# Patient Record
Sex: Female | Born: 1975 | Hispanic: No | Marital: Married | Smoking: Never smoker
Health system: Southern US, Community
[De-identification: ages and names within clinical notes are randomized; demographics above are authoritative.]

---

## 2015-04-02 ENCOUNTER — Encounter (HOSPITAL_COMMUNITY): Payer: Self-pay | Admitting: Emergency Medicine

## 2015-04-02 ENCOUNTER — Emergency Department (INDEPENDENT_AMBULATORY_CARE_PROVIDER_SITE_OTHER)
Admission: EM | Admit: 2015-04-02 | Discharge: 2015-04-02 | Disposition: A | Discharge status: Home / Self Care | Payer: Medicaid Other | Source: Home / Self Care | Attending: Emergency Medicine | Admitting: Emergency Medicine

## 2015-04-02 DIAGNOSIS — J039 Acute tonsillitis, unspecified: Secondary | ICD-10-CM

## 2015-04-02 DIAGNOSIS — R0981 Nasal congestion: Secondary | ICD-10-CM | POA: Diagnosis not present

## 2015-04-02 MED ORDER — AMOXICILLIN 500 MG PO CAPS: 500.0000 mg | ORAL_CAPSULE | Freq: Two times a day (BID) | ORAL | Status: DC

## 2015-04-02 MED ORDER — FLUTICASONE PROPIONATE 50 MCG/ACT NA SUSP: 2.0000 | Freq: Every day | NASAL | Status: DC

## 2015-04-02 MED ORDER — CETIRIZINE HCL 10 MG PO TABS: 10.0000 mg | ORAL_TABLET | Freq: Every day | ORAL | Status: DC

## 2015-04-02 NOTE — ED Notes (Signed)
Via phone interpreter (Swahili) Pt c/o cold sx onset 2 days Sx include HA, bilateral ear pain, ST, congestion, bilateral eye pain Denies fevers Alert, no signs of acute distress.

## 2015-04-02 NOTE — Discharge Instructions (Signed)
You have an infection of your sinuses and throat. Take amoxicillin twice a day for 10 days. Use the Zyrtec and Flonase daily for the next 10 days. Back in one week if not better.

## 2015-04-02 NOTE — ED Provider Notes (Signed)
CSN: [id number]     Arrival date & time 04/02/15  1342 History   First MD Initiated Contact with Patient 04/02/15 1555     Chief Complaint  Patient presents with  . URI   (Consider location/radiation/quality/duration/timing/severity/associated sxs/prior Treatment) HPI  She is a 39 year old woman here for evaluation of sinus pressure. History is limited by language barrier, phone interpreter was used. She states she has nasal congestion, sinus pressure, ear pain, eye pain, sore throat, and headaches. This has been going on for at least several days, maybe closer to a year. She denies any cough or shortness of breath. No fevers or chills. She has not tried any medications. She also reports a long history of double vision.  She has a history significant for trauma and [place] in [place].  History reviewed. No pertinent past medical history. History reviewed. No pertinent past surgical history. No family history on file. History  Substance Use Topics  . Smoking status: Never Smoker   . Smokeless tobacco: Not on file  . Alcohol Use: No   OB History    No data available     Review of Systems As in history of present illness Allergies  Review of patient's allergies indicates no known allergies.  Home Medications   Prior to Admission medications   Medication Sig Start Date End Date Taking? Authorizing Provider  amoxicillin (AMOXIL) 500 MG capsule Take 1 capsule (500 mg total) by mouth 2 (two) times daily. 04/02/15   Charm RingsErin J Honig, MD  cetirizine (ZYRTEC) 10 MG tablet Take 1 tablet (10 mg total) by mouth daily. 04/02/15   Charm RingsErin J Honig, MD  fluticasone (FLONASE) 50 MCG/ACT nasal spray Place 2 sprays into both nostrils daily. 04/02/15   Charm RingsErin J Honig, MD   BP 138/93 mmHg  Pulse 81  Temp(Src) 98.3 F (36.8 C) (Oral)  Resp 16  SpO2 99%  LMP 03/15/2015 Physical Exam  Constitutional: She is oriented to person, place, and time. She appears well-developed and well-nourished. No distress.  HENT:   Mouth/Throat: Oropharyngeal exudate present.  Nasal mucosa is erythematous and boggy. Bilateral TMs are normal. Posterior pharynx is erythematous with exudate.  Eyes: Conjunctivae and EOM are normal. Pupils are equal, round, and reactive to light.  Neck: Neck supple.  Cardiovascular: Normal rate, regular rhythm and normal heart sounds.   No murmur heard. Pulmonary/Chest: Effort normal and breath sounds normal. No respiratory distress. She has no wheezes. She has no rales.  Lymphadenopathy:    She has no cervical adenopathy.  Neurological: She is alert and oriented to person, place, and time.  Psychiatric:  Flat affect    ED Course  Procedures (including critical care time) Labs Review Labs Reviewed - No data to display  Imaging Review No results found.   MDM   1. Exudative tonsillitis   2. Sinus congestion    Treat with amoxicillin, Zyrtec, Flonase. Follow-up in one week if not improving.    Charm RingsErin J Honig, MD 04/02/15 [phone]

## 2015-05-15 ENCOUNTER — Emergency Department (HOSPITAL_COMMUNITY)
Admission: EM | Admit: 2015-05-15 | Discharge: 2015-05-15 | Disposition: A | Discharge status: Home / Self Care | Payer: Medicaid Other | Source: Home / Self Care | Attending: Family Medicine | Admitting: Family Medicine

## 2015-05-15 ENCOUNTER — Encounter (HOSPITAL_COMMUNITY): Payer: Self-pay | Admitting: *Deleted

## 2015-05-15 ENCOUNTER — Emergency Department (INDEPENDENT_AMBULATORY_CARE_PROVIDER_SITE_OTHER): Payer: Medicaid Other

## 2015-05-15 DIAGNOSIS — G8929 Other chronic pain: Secondary | ICD-10-CM

## 2015-05-15 DIAGNOSIS — R0789 Other chest pain: Secondary | ICD-10-CM | POA: Diagnosis not present

## 2015-05-15 LAB — POCT PREGNANCY, URINE: Preg Test, Ur: NEGATIVE

## 2015-05-15 IMAGING — DX DG CHEST 2V
2 series · 2 of 2 positions shown · non-contrast
Comparison: None.

CLINICAL DATA: Chest pain

EXAM:
CHEST  2 VIEW

[chest pa]
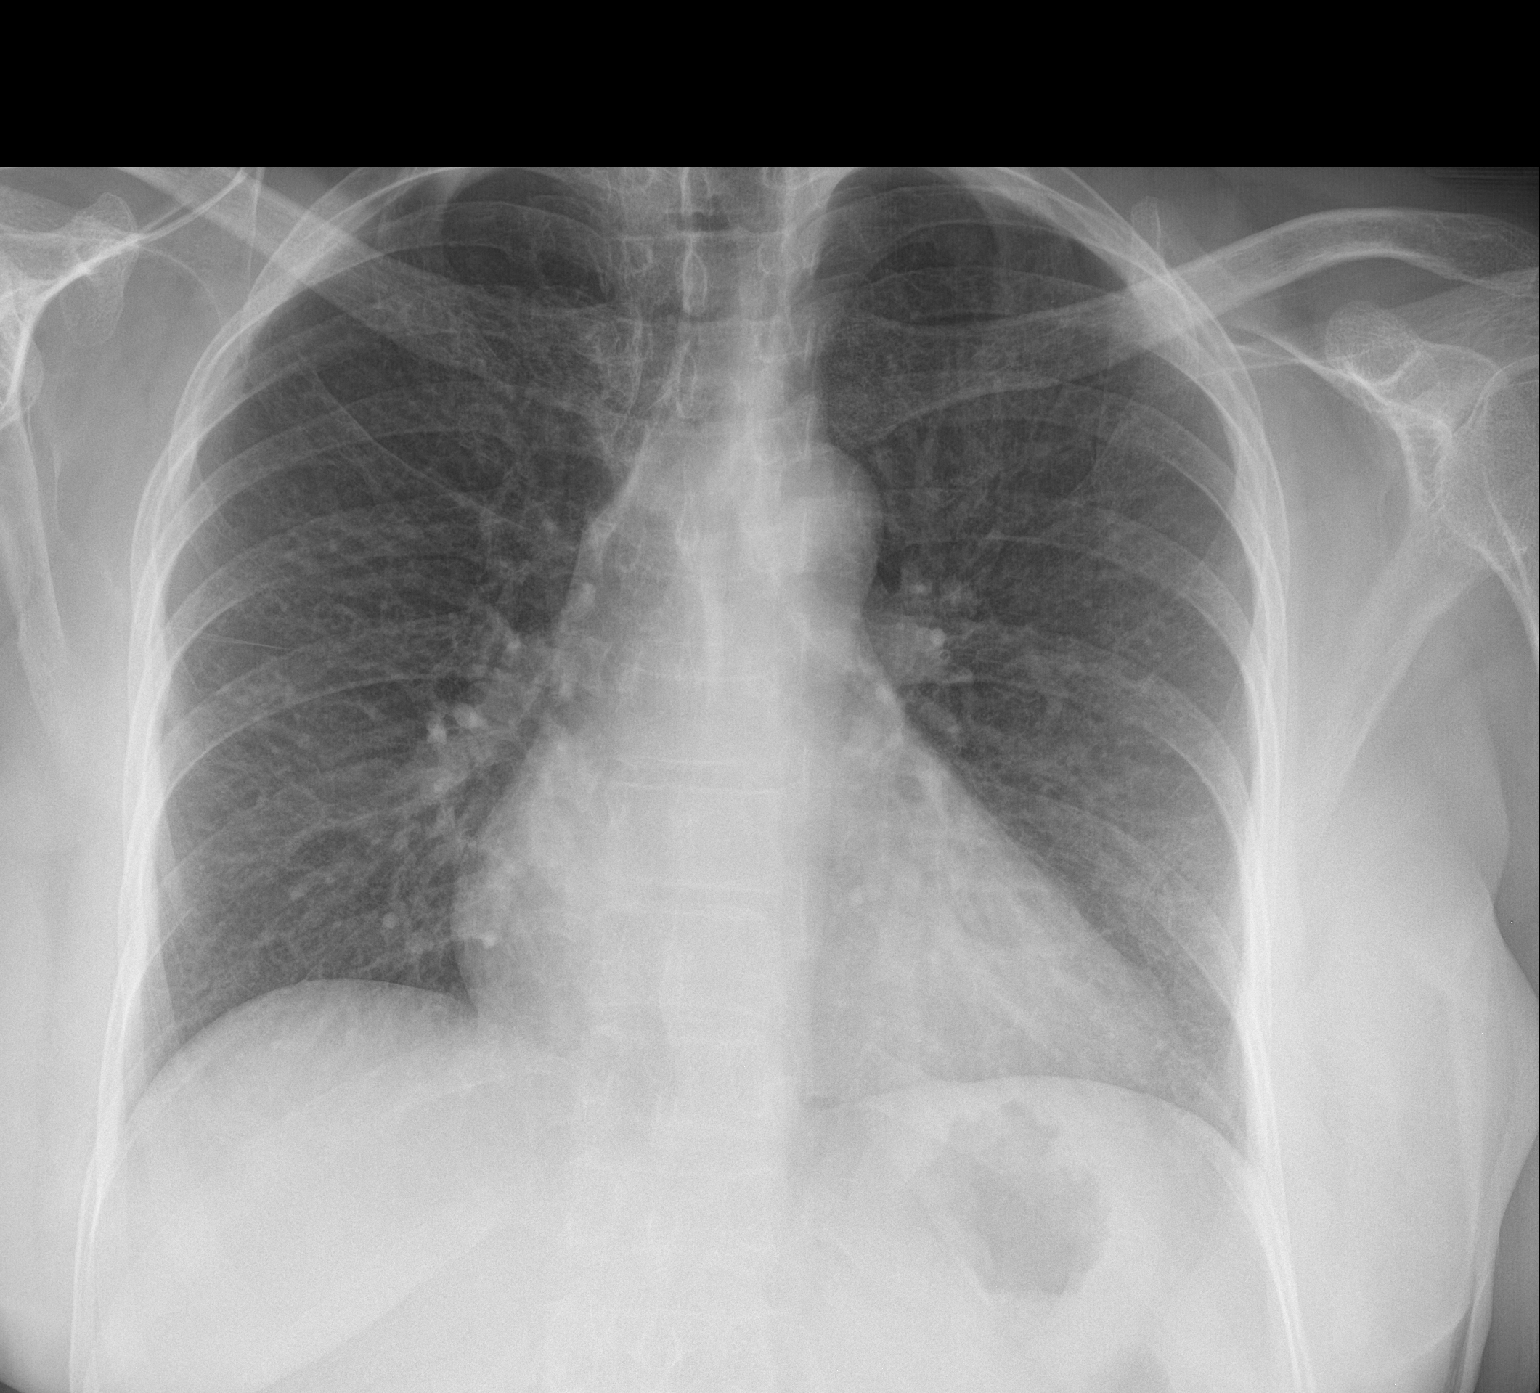

[chest lat]
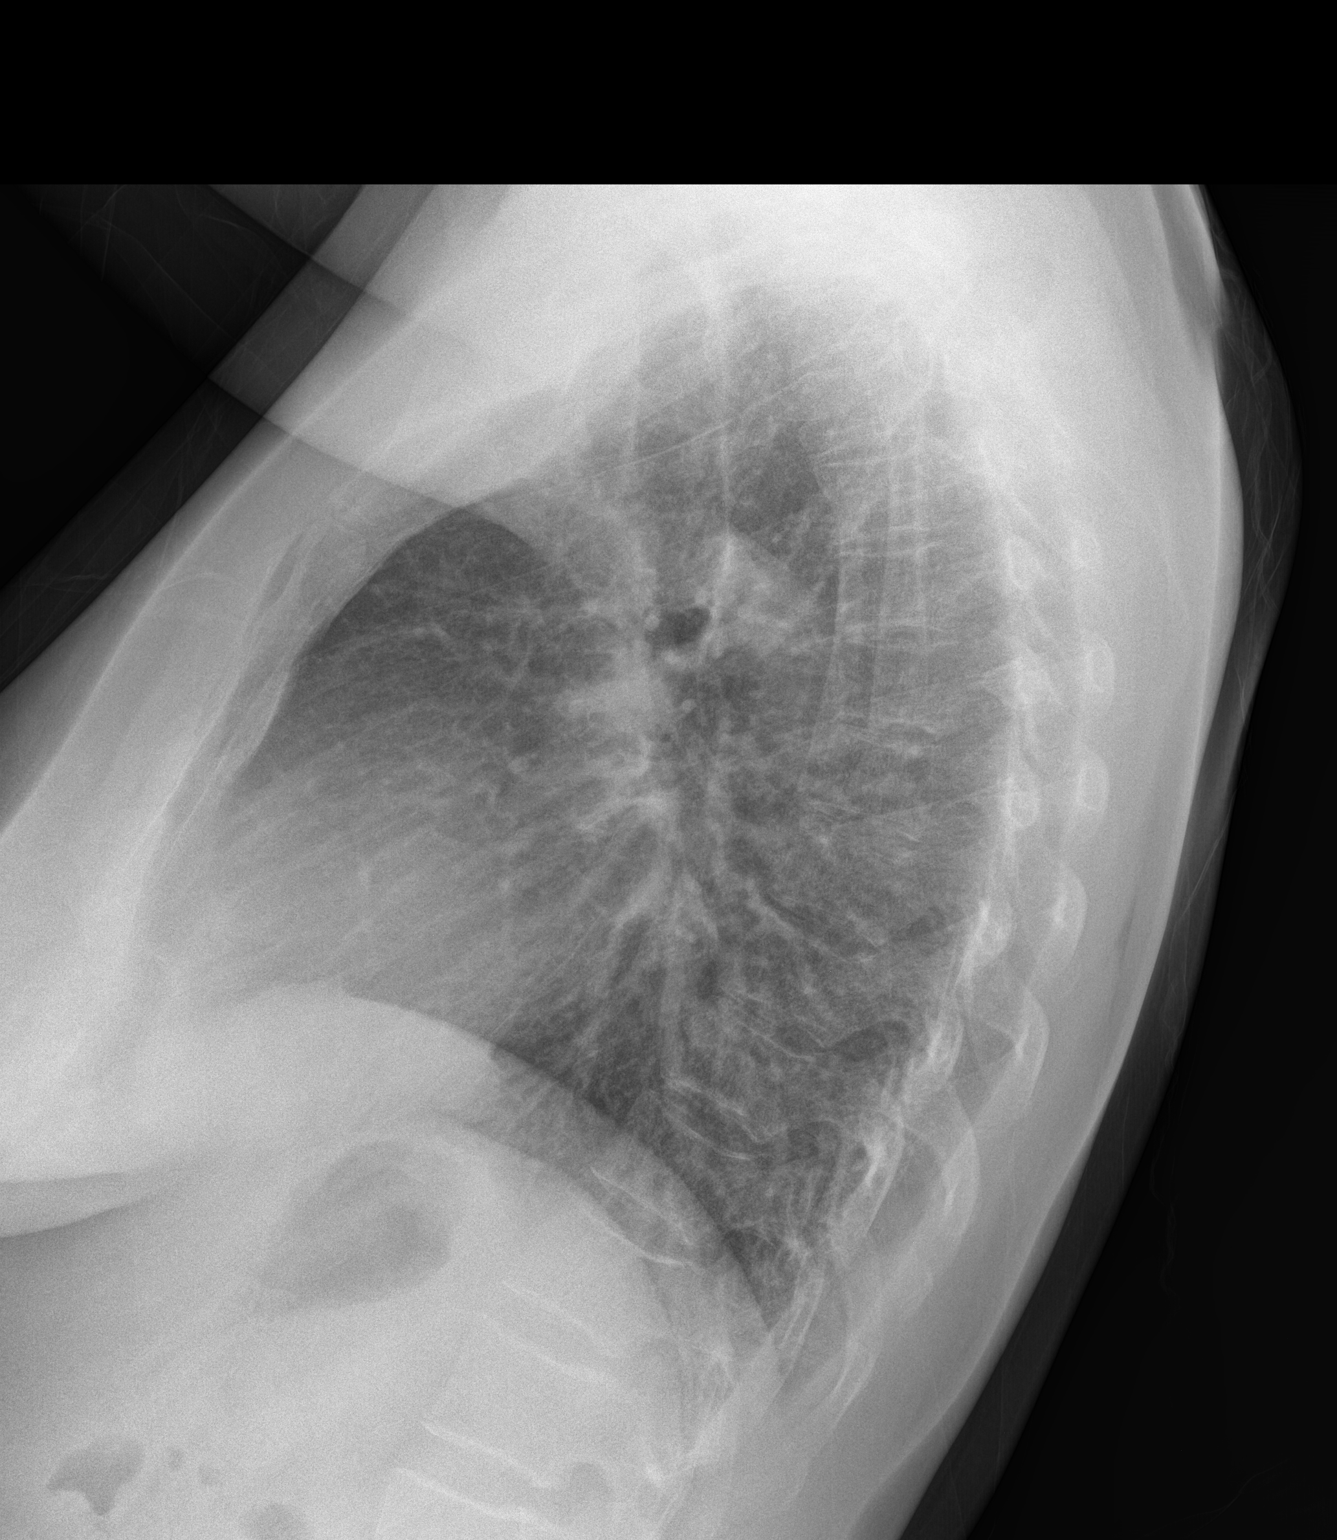

[2 of 2 positions shown; findings below may reference images not displayed]

FINDINGS: The heart size and mediastinal contours are within normal limits.
Both lungs are clear. The visualized skeletal structures are
unremarkable.
IMPRESSION: No active cardiopulmonary disease.

## 2015-05-15 MED ORDER — MELOXICAM 7.5 MG PO TABS: 7.5000 mg | ORAL_TABLET | Freq: Two times a day (BID) | ORAL | Status: DC

## 2015-05-15 NOTE — ED Notes (Addendum)
Pt  Reports   Headache   And   Pain in   Chest   When  She  Breathes        Symptoms        X        1  Year            Unsure        As  To her  Last     menstrural  Period             [address]    Interpretors  Utilized

## 2015-05-15 NOTE — ED Provider Notes (Signed)
CSN: [id number]     Arrival date & time 05/15/15  1306 History   First MD Initiated Contact with Patient 05/15/15 1349     Chief Complaint  Patient presents with  . Headache   (Consider location/radiation/quality/duration/timing/severity/associated sxs/prior Treatment) Patient is a 39 y.o. female presenting with headaches. The history is provided by the patient. The history is limited by a language barrier. A language interpreter was used.  Headache Pain location:  Generalized Quality:  Dull Onset quality:  Gradual Timing:  Constant Chronicity:  New Similar to prior headaches: no   Relieved by:  None tried Associated symptoms: back pain   Associated symptoms: no drainage, no facial pain, no fever and no sore throat     History reviewed. No pertinent past medical history. History reviewed. No pertinent past surgical history. History reviewed. No pertinent family history. Social History  Substance Use Topics  . Smoking status: Never Smoker   . Smokeless tobacco: None  . Alcohol Use: No   OB History    No data available     Review of Systems  Constitutional: Negative.  Negative for fever.  HENT: Negative for postnasal drip and sore throat.   Respiratory: Negative.   Cardiovascular: Positive for chest pain. Negative for palpitations and leg swelling.  Gastrointestinal: Negative.   Genitourinary: Negative.   Musculoskeletal: Positive for back pain.  Neurological: Positive for headaches.    Allergies  Review of patient's allergies indicates no known allergies.  Home Medications   Prior to Admission medications   Medication Sig Start Date End Date Taking? Authorizing Provider  amoxicillin (AMOXIL) 500 MG capsule Take 1 capsule (500 mg total) by mouth 2 (two) times daily. 04/02/15   Charm Rings, MD  cetirizine (ZYRTEC) 10 MG tablet Take 1 tablet (10 mg total) by mouth daily. 04/02/15   Charm Rings, MD  fluticasone (FLONASE) 50 MCG/ACT nasal spray Place 2 sprays into both  nostrils daily. 04/02/15   Charm Rings, MD  meloxicam (MOBIC) 7.5 MG tablet Take 1 tablet (7.5 mg total) by mouth 2 (two) times daily after a meal. 05/15/15   Linna Hoff, MD   BP 122/85 mmHg  Pulse 75  Temp(Src) 98 F (36.7 C) (Oral)  Resp 16  SpO2 98%  LMP 05/15/2015 (Within Months) Physical Exam  Constitutional: She is oriented to person, place, and time. She appears well-developed and well-nourished. No distress.  HENT:  Right Ear: External ear normal.  Left Ear: External ear normal.  Mouth/Throat: Oropharynx is clear and moist.  Eyes: EOM are normal. Pupils are equal, round, and reactive to light.  Neck: Normal range of motion. Neck supple.  Cardiovascular: Normal rate, regular rhythm and normal heart sounds.   Pulmonary/Chest: Effort normal and breath sounds normal.  Abdominal: There is no tenderness.  Lymphadenopathy:    She has no cervical adenopathy.  Neurological: She is alert and oriented to person, place, and time.  Skin: Skin is warm and dry.  Nursing note and vitals reviewed.   ED Course  Procedures (including critical care time) Labs Review Labs Reviewed  POCT PREGNANCY, URINE    Imaging Review Dg Chest 2 View  05/15/2015   CLINICAL DATA:  Chest pain  EXAM: CHEST  2 VIEW  COMPARISON:  None.  FINDINGS: The heart size and mediastinal contours are within normal limits. Both lungs are clear. The visualized skeletal structures are unremarkable.  IMPRESSION: No active cardiopulmonary disease.   Electronically Signed   By: Lanette Hampshire.D.  On: 05/15/2015 14:35   X-rays reviewed and report per radiologist.   MDM   1. Chest wall pain, chronic        Linna Hoff, MD 05/15/15 1452

## 2015-05-22 ENCOUNTER — Emergency Department (HOSPITAL_COMMUNITY): Payer: Medicaid Other

## 2015-05-22 ENCOUNTER — Encounter (HOSPITAL_COMMUNITY): Payer: Self-pay | Admitting: Family Medicine

## 2015-05-22 ENCOUNTER — Emergency Department (HOSPITAL_COMMUNITY)
Admission: EM | Admit: 2015-05-22 | Discharge: 2015-05-22 | Disposition: A | Discharge status: Home / Self Care | Payer: Medicaid Other | Attending: Emergency Medicine | Admitting: Emergency Medicine

## 2015-05-22 DIAGNOSIS — J029 Acute pharyngitis, unspecified: Secondary | ICD-10-CM | POA: Diagnosis not present

## 2015-05-22 DIAGNOSIS — G8929 Other chronic pain: Secondary | ICD-10-CM | POA: Insufficient documentation

## 2015-05-22 DIAGNOSIS — R0789 Other chest pain: Secondary | ICD-10-CM | POA: Insufficient documentation

## 2015-05-22 DIAGNOSIS — Z79899 Other long term (current) drug therapy: Secondary | ICD-10-CM | POA: Insufficient documentation

## 2015-05-22 DIAGNOSIS — Z7951 Long term (current) use of inhaled steroids: Secondary | ICD-10-CM | POA: Diagnosis not present

## 2015-05-22 DIAGNOSIS — Z792 Long term (current) use of antibiotics: Secondary | ICD-10-CM | POA: Insufficient documentation

## 2015-05-22 DIAGNOSIS — R51 Headache: Secondary | ICD-10-CM | POA: Diagnosis not present

## 2015-05-22 LAB — BASIC METABOLIC PANEL
Anion gap: 9 (ref 5–15)
BUN: 9 mg/dL (ref 6–20)
CHLORIDE: 107 mmol/L (ref 101–111)
CO2: 23 mmol/L (ref 22–32)
CREATININE: 0.68 mg/dL (ref 0.44–1.00)
Calcium: 10.1 mg/dL (ref 8.9–10.3)
GFR calc non Af Amer: >60 mL/min (ref >60)
GLUCOSE: 94 mg/dL (ref 65–99)
Potassium: 4.1 mmol/L (ref 3.5–5.1)
Sodium: 139 mmol/L (ref 135–145)

## 2015-05-22 LAB — RAPID STREP SCREEN (MED CTR MEBANE ONLY): Streptococcus, Group A Screen (Direct): NEGATIVE

## 2015-05-22 LAB — CBC
HCT: 39.2 % (ref 36.0–46.0)
Hemoglobin: 13.6 g/dL (ref 12.0–15.0)
MCH: 28.6 pg (ref 26.0–34.0)
MCHC: 34.7 g/dL (ref 30.0–36.0)
MCV: 82.5 fL (ref 78.0–100.0)
Platelets: 183 10*3/uL (ref 150–400)
RBC: 4.75 MIL/uL (ref 3.87–5.11)
RDW: 12.9 % (ref 11.5–15.5)
WBC: 5.1 10*3/uL (ref 4.0–10.5)

## 2015-05-22 LAB — I-STAT TROPONIN, ED: Troponin i, poc: 0 ng/mL (ref 0.00–0.08)

## 2015-05-22 MED ORDER — PHENOL 1.4 % MT LIQD: 1.0000 | OROMUCOSAL | Status: DC | PRN

## 2015-05-22 NOTE — ED Notes (Signed)
Per translator pt here for fever, chest pain, sore throat and headache. Seen here for the same before. sts also neck pain.

## 2015-05-22 NOTE — Discharge Instructions (Signed)
Use throat spray as directed. Continue taking the meloxicam prescribed by urgent care for your chronic chest wall pain. Follow-up with your primary care physician.  Chest Wall Pain Chest wall pain is pain in or around the bones and muscles of your chest. It may take up to 6 weeks to get better. It may take longer if you must stay physically active in your work and activities.  CAUSES  Chest wall pain may happen on its own. However, it may be caused by:  A viral illness like the flu.  Injury.  Coughing.  Exercise.  Arthritis.  Fibromyalgia.  Shingles. HOME CARE INSTRUCTIONS   Avoid overtiring physical activity. Try not to strain or perform activities that cause pain. This includes any activities using your chest or your abdominal and side muscles, especially if heavy weights are used.  Put ice on the sore area.  Put ice in a plastic bag.  Place a towel between your skin and the bag.  Leave the ice on for 15-20 minutes per hour while awake for the first 2 days.  Only take over-the-counter or prescription medicines for pain, discomfort, or fever as directed by your caregiver. SEEK IMMEDIATE MEDICAL CARE IF:   Your pain increases, or you are very uncomfortable.  You have a fever.  Your chest pain becomes worse.  You have new, unexplained symptoms.  You have nausea or vomiting.  You feel sweaty or lightheaded.  You have a cough with phlegm (sputum), or you cough up blood. MAKE SURE YOU:   Understand these instructions.  Will watch your condition.  Will get help right away if you are not doing well or get worse. Document Released: 09/13/2005 Document Revised: 12/06/2011 Document Reviewed: 05/10/2011 [facility] Patient Information 2015 [place], [place]. This information is not intended to replace advice given to you by your health care provider. Make sure you discuss any questions you have with your health care provider.  Pharyngitis Pharyngitis is redness, pain, and  swelling (inflammation) of your pharynx.  CAUSES  Pharyngitis is usually caused by infection. Most of the time, these infections are from viruses (viral) and are part of a cold. However, sometimes pharyngitis is caused by bacteria (bacterial). Pharyngitis can also be caused by allergies. Viral pharyngitis may be spread from person to person by coughing, sneezing, and personal items or utensils (cups, forks, spoons, toothbrushes). Bacterial pharyngitis may be spread from person to person by more intimate contact, such as kissing.  SIGNS AND SYMPTOMS  Symptoms of pharyngitis include:   Sore throat.   Tiredness (fatigue).   Low-grade fever.   Headache.  Joint pain and muscle aches.  Skin rashes.  Swollen lymph nodes.  Plaque-like film on throat or tonsils (often seen with bacterial pharyngitis). DIAGNOSIS  Your health care provider will ask you questions about your illness and your symptoms. Your medical history, along with a physical exam, is often all that is needed to diagnose pharyngitis. Sometimes, a rapid strep test is done. Other lab tests may also be done, depending on the suspected cause.  TREATMENT  Viral pharyngitis will usually get better in 3-4 days without the use of medicine. Bacterial pharyngitis is treated with medicines that kill germs (antibiotics).  HOME CARE INSTRUCTIONS   Drink enough water and fluids to keep your urine clear or pale yellow.   Only take over-the-counter or prescription medicines as directed by your health care provider:   If you are prescribed antibiotics, make sure you finish them even if you start to feel  better.   Do not take aspirin.   Get lots of rest.   Gargle with 8 oz of salt water ( tsp of salt per 1 qt of water) as often as every 1-2 hours to soothe your throat.   Throat lozenges (if you are not at risk for choking) or sprays may be used to soothe your throat. SEEK MEDICAL CARE IF:   You have large, tender lumps in  your neck.  You have a rash.  You cough up green, yellow-brown, or bloody spit. SEEK IMMEDIATE MEDICAL CARE IF:   Your neck becomes stiff.  You drool or are unable to swallow liquids.  You vomit or are unable to keep medicines or liquids down.  You have severe pain that does not go away with the use of recommended medicines.  You have trouble breathing (not caused by a stuffy nose). MAKE SURE YOU:   Understand these instructions.  Will watch your condition.  Will get help right away if you are not doing well or get worse. Document Released: 09/13/2005 Document Revised: 07/04/2013 Document Reviewed: 05/21/2013 [facility] Patient Information 2015 [place], [place]. This information is not intended to replace advice given to you by your health care provider. Make sure you discuss any questions you have with your health care provider.

## 2015-05-22 NOTE — ED Provider Notes (Signed)
CSN: [id number]     Arrival date & time 05/22/15  1055 History   First MD Initiated Contact with Patient 05/22/15 1138     Chief Complaint  Patient presents with  . Sore Throat  . Headache     (Consider location/radiation/quality/duration/timing/severity/associated sxs/prior Treatment) HPI Comments: 39 year old female presenting with sore throat 3 days. Throat gradually worsening, worse with swallowing. Reports pain even when swallowing water. Admits to associated generalized headache. Despite triage summary, patient denies fevers. Has some pain to the anterior aspect of her neck from swollen glands. Denies cough, shortness of breath, wheezing, chest tightness. No sick contacts. Also complaining of pain to the middle of her chest which he takes fatigue breath or does certain movements that has been ongoing for the past year. Nothing new in her chest today making her comfortable emergency department. Was prescribed mobic by urgent care with no change.  Patient is a 40 y.o. female presenting with pharyngitis and headaches. The history is provided by the patient. The history is limited by a language barrier. A language interpreter was used.  Sore Throat This is a new problem. The current episode started in the past 7 days. The problem occurs constantly. The problem has been gradually worsening. Associated symptoms include chest pain, headaches and a sore throat. The symptoms are aggravated by swallowing. She has tried nothing for the symptoms.  Headache Associated symptoms: sore throat     History reviewed. No pertinent past medical history. History reviewed. No pertinent past surgical history. History reviewed. No pertinent family history. Social History  Substance Use Topics  . Smoking status: Never Smoker   . Smokeless tobacco: None  . Alcohol Use: No   OB History    No data available     Review of Systems  HENT: Positive for sore throat.   Cardiovascular: Positive for chest pain.   Neurological: Positive for headaches.  Hematological: Positive for adenopathy.      Allergies  Review of patient's allergies indicates no known allergies.  Home Medications   Prior to Admission medications   Medication Sig Start Date End Date Taking? Authorizing Provider  amoxicillin (AMOXIL) 500 MG capsule Take 1 capsule (500 mg total) by mouth 2 (two) times daily. 04/02/15   Charm Rings, MD  cetirizine (ZYRTEC) 10 MG tablet Take 1 tablet (10 mg total) by mouth daily. 04/02/15   Charm Rings, MD  fluticasone (FLONASE) 50 MCG/ACT nasal spray Place 2 sprays into both nostrils daily. 04/02/15   Charm Rings, MD  meloxicam (MOBIC) 7.5 MG tablet Take 1 tablet (7.5 mg total) by mouth 2 (two) times daily after a meal. 05/15/15   Linna Hoff, MD  phenol (CHLORASEPTIC) 1.4 % LIQD Use as directed 1 spray in the mouth or throat as needed for throat irritation / pain. 05/22/15   Mauri Tolen M Sidi Dzikowski, PA-C   BP 130/80 mmHg  Pulse 79  Temp(Src) 98.7 F (37.1 C)  Resp 21  SpO2 99%  LMP 05/15/2015 (Within Months) Physical Exam  Constitutional: She is oriented to person, place, and time. She appears well-developed and well-nourished. No distress.  HENT:  Head: Normocephalic and atraumatic.  Mouth/Throat: Mucous membranes are normal. Posterior oropharyngeal edema and posterior oropharyngeal erythema present. No oropharyngeal exudate or tonsillar abscesses.  Uvula absent.  Eyes: Conjunctivae and EOM are normal. Pupils are equal, round, and reactive to light.  Neck: Normal range of motion. Neck supple. No JVD present.  No meningismus.  Cardiovascular: Normal rate, regular rhythm, normal  heart sounds and intact distal pulses.   No extremity edema.  Pulmonary/Chest: Effort normal and breath sounds normal. No respiratory distress. She exhibits tenderness.  Abdominal: Soft. Bowel sounds are normal. There is no tenderness.  Musculoskeletal: Normal range of motion. She exhibits no edema.  Lymphadenopathy:     She has no cervical adenopathy.  Neurological: She is alert and oriented to person, place, and time. She has normal strength. No sensory deficit.  Speech fluent, goal oriented. Moves extremities without ataxia. Equal grip strength bilateral.  Skin: Skin is warm and dry. She is not diaphoretic.  Psychiatric: She has a normal mood and affect. Her behavior is normal.  Nursing note and vitals reviewed.   ED Course  Procedures (including critical care time) Labs Review Labs Reviewed  RAPID STREP SCREEN (NOT AT [facility])  CULTURE, GROUP A STREP  BASIC METABOLIC PANEL  CBC  I-STAT TROPOININ, ED    Imaging Review Dg Chest 2 View  05/22/2015   CLINICAL DATA:  Chest pain.  EXAM: CHEST  2 VIEW  COMPARISON:  May 15, 2015.  FINDINGS: The heart size and mediastinal contours are within normal limits. Both lungs are clear. No pneumothorax or pleural effusion is noted. The visualized skeletal structures are unremarkable.  IMPRESSION: No active cardiopulmonary disease.   Electronically Signed   By: Lupita Raider, M.D.   On: 05/22/2015 12:21   I have personally reviewed and evaluated these images and lab results as part of my medical decision-making.   EKG Interpretation None      MDM   Final diagnoses:  Pharyngitis  Chronic chest wall pain   Non-toxic appearing, NAD. AFVSS. Labs and CXR were ordered by triage prior to evaluation, no acute findings. Chest wall pain is chronic over the past year. Reproducible. Doubt cardiac. Doubt PE. PERC negative. HEART score 0. Rapid strep negative. No meningeal signs. Symptomatic treatment. Stable for d/c. Return precautions given. Patient states understanding of treatment care plan and is agreeable.  Kathrynn Speed, PA-C 05/22/15 1948  Donnetta Hutching, MD 05/24/15 [phone]

## 2015-05-22 NOTE — ED Notes (Signed)
Pt is in stable condition upon d/c and is escorted from ED via wheelchair. 

## 2015-05-25 LAB — CULTURE, GROUP A STREP: Strep A Culture: NEGATIVE

## 2015-06-09 ENCOUNTER — Emergency Department (INDEPENDENT_AMBULATORY_CARE_PROVIDER_SITE_OTHER)
Admission: EM | Admit: 2015-06-09 | Discharge: 2015-06-09 | Disposition: A | Discharge status: Home / Self Care | Payer: Medicaid Other | Source: Home / Self Care | Attending: Family Medicine | Admitting: Family Medicine

## 2015-06-09 ENCOUNTER — Encounter (HOSPITAL_COMMUNITY): Payer: Self-pay | Admitting: Emergency Medicine

## 2015-06-09 DIAGNOSIS — G44229 Chronic tension-type headache, not intractable: Secondary | ICD-10-CM

## 2015-06-09 DIAGNOSIS — M542 Cervicalgia: Secondary | ICD-10-CM | POA: Diagnosis not present

## 2015-06-09 DIAGNOSIS — R0789 Other chest pain: Secondary | ICD-10-CM

## 2015-06-09 MED ORDER — NAPROXEN 375 MG PO TABS: 375.0000 mg | ORAL_TABLET | Freq: Two times a day (BID) | ORAL | Status: DC

## 2015-06-09 NOTE — Discharge Instructions (Signed)
Chest Wall Pain Chest wall pain is pain felt in or around the chest bones and muscles. It may take up to 6 weeks to get better. It may take longer if you are active. Chest wall pain can happen on its own. Other times, things like germs, injury, coughing, or exercise can cause the pain. HOME CARE   Avoid activities that make you tired or cause pain. Try not to use your chest, belly (abdominal), or side muscles. Do not use heavy weights.  Put ice on the sore area.  Put ice in a plastic bag.  Place a towel between your skin and the bag.  Leave the ice on for 15-20 minutes for the first 2 days.  Only take medicine as told by your doctor. GET HELP RIGHT AWAY IF:   You have more pain or are very uncomfortable.  You have a fever.  Your chest pain gets worse.  You have new problems.  You feel sick to your stomach (nauseous) or throw up (vomit).  You start to sweat or feel lightheaded.  You have a cough with mucus (phlegm).  You cough up blood. MAKE SURE YOU:   Understand these instructions.  Will watch your condition.  Will get help right away if you are not doing well or get worse. Document Released: 03/01/2008 Document Revised: 12/06/2011 Document Reviewed: 05/10/2011 [facility] Patient Information 2015 [place], [place]. This information is not intended to replace advice given to you by your health care provider. Make sure you discuss any questions you have with your health care provider.  Tension Headache A tension headache is pain, pressure, or aching felt over the front and sides of the head. Tension headaches often come after stress, feeling worried (anxiety), or feeling sad or down for a while (depressed). HOME CARE  Only take medicine as told by your doctor.  Lie down in a dark, quiet room when you have a headache.  Keep a journal to find out if certain things bring on headaches. For example, write down:  What you eat and drink.  How much sleep you get.  Any  change to your diet or medicines.  Relax by getting a massage or doing other relaxing activities.  Put ice or heat packs on the head and neck area as told by your doctor.  Lessen stress.  Sit up straight. Do not tighten (tense) your muscles.  Quit smoking if you smoke.  Lessen how much alcohol you drink.  Lessen how much caffeine you drink, or stop drinking caffeine.  Eat and exercise regularly.  Get enough sleep.  Avoid using too much pain medicine. GET HELP RIGHT AWAY IF:   Your headache becomes really bad.  You have a fever.  You have a stiff neck.  You have trouble seeing.  Your muscles are weak, or you lose muscle control.  You lose your balance or have trouble walking.  You feel like you will pass out (faint), or you pass out.  You have really bad symptoms that are different than your first symptoms.  You have problems with the medicines given to you by your doctor.  Your medicines do not work.  Your headache feels different than the other headaches.  You feel sick to your stomach (nauseous) or throw up (vomit). MAKE SURE YOU:   Understand these instructions.  Will watch your condition.  Will get help right away if you are not doing well or get worse. Document Released: 12/08/2009 Document Revised: 12/06/2011 Document Reviewed: 09/03/2011 [employer] Patient  Information 2015 [place], [place]. This information is not intended to replace advice given to you by your health care provider. Make sure you discuss any questions you have with your health care provider.

## 2015-06-09 NOTE — ED Provider Notes (Signed)
CSN: [id number]     Arrival date & time 06/09/15  1642 History   First MD Initiated Contact with Patient 06/09/15 1754     Chief Complaint  Patient presents with  . Generalized Body Aches   (Consider location/radiation/quality/duration/timing/severity/associated sxs/prior Treatment) HPI Comments: 39 year old African female who speaks Swahili presents for the third time for the same complaints of a headache for one year. The headache is located behind the head and occipital area radiating to the vertex. Posterior and anterior neck pain, 2 weeks of sternal pain and costal margin pain although previous visits are documented as a longer period of time, ear pain and nose feels like there is pepper in it. The chest wall pain is worse with movement, manual pressure and taking a deep breath. Most of the symptoms have been occurring for several months to a year. Patient states there is no difference in her complaints today as they have been before. No history of trauma. LMP 03/11/2015. She is utilizing the Depakote injection.  The history is provided by the patient. The history is limited by a language barrier. A language interpreter was used.    History reviewed. No pertinent past medical history. History reviewed. No pertinent past surgical history. History reviewed. No pertinent family history. Social History  Substance Use Topics  . Smoking status: Never Smoker   . Smokeless tobacco: None  . Alcohol Use: No   OB History    No data available     Review of Systems  Constitutional: Negative for fever, activity change and fatigue.  HENT: Positive for ear pain. Negative for congestion, ear discharge, rhinorrhea, sinus pressure, sneezing, sore throat and trouble swallowing.   Eyes: Negative for visual disturbance.  Respiratory: Negative for cough, choking and shortness of breath.   Cardiovascular: Positive for chest pain. Negative for leg swelling.  Gastrointestinal: Negative.  Negative for  nausea, vomiting and abdominal pain.  Genitourinary: Negative.   Skin: Negative.   Neurological: Positive for headaches. Negative for dizziness.    Allergies  Review of patient's allergies indicates no known allergies.  Home Medications   Prior to Admission medications   Medication Sig Start Date End Date Taking? Authorizing Provider  amoxicillin (AMOXIL) 500 MG capsule Take 1 capsule (500 mg total) by mouth 2 (two) times daily. 04/02/15   Charm Rings, MD  cetirizine (ZYRTEC) 10 MG tablet Take 1 tablet (10 mg total) by mouth daily. 04/02/15   Charm Rings, MD  fluticasone (FLONASE) 50 MCG/ACT nasal spray Place 2 sprays into both nostrils daily. 04/02/15   Charm Rings, MD  meloxicam (MOBIC) 7.5 MG tablet Take 1 tablet (7.5 mg total) by mouth 2 (two) times daily after a meal. 05/15/15   Linna Hoff, MD  naproxen (NAPROSYN) 375 MG tablet Take 1 tablet (375 mg total) by mouth 2 (two) times daily. 06/09/15   Hayden Rasmussen, NP  phenol (CHLORASEPTIC) 1.4 % LIQD Use as directed 1 spray in the mouth or throat as needed for throat irritation / pain. 05/22/15   Kathrynn Speed, PA-C   Meds Ordered and Administered this Visit  Medications - No data to display  BP 134/86 mmHg  Pulse 83  Temp(Src) 97.6 F (36.4 C) (Oral)  Resp 16  SpO2 98%  LMP 05/15/2015 (Within Months) No data found.   Physical Exam  Constitutional: She is oriented to person, place, and time. She appears well-developed and well-nourished. No distress.  HENT:  Head: Normocephalic and atraumatic.  Mouth/Throat: Oropharynx is  clear and moist. No oropharyngeal exudate.  Bilateral TMs normal.  Eyes: Conjunctivae and EOM are normal. Pupils are equal, round, and reactive to light.  Neck: Normal range of motion. Neck supple.  Cardiovascular: Normal rate and normal heart sounds.   Pulmonary/Chest: Effort normal and breath sounds normal. No respiratory distress. She has no wheezes. She exhibits tenderness.  Palpation of the parasternal  borders as well as the bilateral costal margin reproduces the pain for which she presents and produces much tenderness. Abdomen is not tender.  Abdominal: Soft. There is no tenderness.  Musculoskeletal: Normal range of motion. She exhibits tenderness.  Palpation of the posterior and lateral cervical musculature is tender. Palpation to the occiput to the vertex of the scalp is also tender.  Lymphadenopathy:    She has no cervical adenopathy.  Neurological: She is alert and oriented to person, place, and time. No cranial nerve deficit.  Skin: Skin is warm and dry. No erythema.  Psychiatric: She has a normal mood and affect.  Nursing note and vitals reviewed.   ED Course  Procedures (including critical care time)  Labs Review Labs Reviewed - No data to display  Imaging Review No results found.   Visual Acuity Review  Right Eye Distance:   Left Eye Distance:   Bilateral Distance:    Right Eye Near:   Left Eye Near:    Bilateral Near:         MDM   1. Cervical muscle pain   2. Chest wall pain   3. Chronic tension-type headache, not intractable    Ice to chest. Heat to back of the neck and head. Naprosyn for pain as needed. Other than musculoskeletal tenderness no other significant findings. Neurological exam is normal. Patient is shown the building for which she is to follow-up with tomorrow.  Normal  Hayden Rasmussen, NP 06/09/15 1845

## 2015-06-09 NOTE — ED Notes (Signed)
C/o body aches States she has headache, neck pain, ear pain, facial pressure and mid chest pain States she was seen many times for the same sx meds was used but no relief.

## 2015-07-29 ENCOUNTER — Encounter (HOSPITAL_COMMUNITY): Payer: Self-pay | Admitting: Emergency Medicine

## 2015-07-29 ENCOUNTER — Emergency Department (HOSPITAL_COMMUNITY)
Admission: EM | Admit: 2015-07-29 | Discharge: 2015-07-29 | Disposition: A | Discharge status: Home / Self Care | Payer: Medicaid Other | Attending: Emergency Medicine | Admitting: Emergency Medicine

## 2015-07-29 DIAGNOSIS — Z792 Long term (current) use of antibiotics: Secondary | ICD-10-CM | POA: Insufficient documentation

## 2015-07-29 DIAGNOSIS — H9209 Otalgia, unspecified ear: Secondary | ICD-10-CM | POA: Diagnosis not present

## 2015-07-29 DIAGNOSIS — Z79899 Other long term (current) drug therapy: Secondary | ICD-10-CM | POA: Insufficient documentation

## 2015-07-29 DIAGNOSIS — Z7951 Long term (current) use of inhaled steroids: Secondary | ICD-10-CM | POA: Diagnosis not present

## 2015-07-29 DIAGNOSIS — J029 Acute pharyngitis, unspecified: Secondary | ICD-10-CM

## 2015-07-29 DIAGNOSIS — Z791 Long term (current) use of non-steroidal anti-inflammatories (NSAID): Secondary | ICD-10-CM | POA: Diagnosis not present

## 2015-07-29 LAB — RAPID STREP SCREEN (MED CTR MEBANE ONLY): STREPTOCOCCUS, GROUP A SCREEN (DIRECT): NEGATIVE

## 2015-07-29 MED ORDER — IBUPROFEN 800 MG PO TABS
800.0000 mg | ORAL_TABLET | Freq: Once | ORAL | Status: AC
  Administered 2015-07-29: 800 mg via ORAL
  Filled 2015-07-29: qty 1

## 2015-07-29 MED ORDER — GUAIFENESIN-CODEINE 100-10 MG/5ML PO SOLN: 5.0000 mL | Freq: Four times a day (QID) | ORAL | Status: DC | PRN

## 2015-07-29 NOTE — Discharge Instructions (Signed)
Sore Throat A sore throat is pain, burning, irritation, or scratchiness of the throat. There is often pain or tenderness when swallowing or talking. A sore throat may be accompanied by other symptoms, such as coughing, sneezing, fever, and swollen neck glands. A sore throat is often the first sign of another sickness, such as a cold, flu, strep throat, or mononucleosis (commonly known as mono). Most sore throats go away without medical treatment. CAUSES  The most common causes of a sore throat include:  A viral infection, such as a cold, flu, or mono.  A bacterial infection, such as strep throat, tonsillitis, or whooping cough.  Seasonal allergies.  Dryness in the air.  Irritants, such as smoke or pollution.  Gastroesophageal reflux disease (GERD). HOME CARE INSTRUCTIONS   Only take over-the-counter medicines as directed by your caregiver.  Drink enough fluids to keep your urine clear or pale yellow.  Rest as needed.  Try using throat sprays, lozenges, or sucking on hard candy to ease any pain (if older than 4 years or as directed).  Sip warm liquids, such as broth, herbal tea, or warm water with honey to relieve pain temporarily. You may also eat or drink cold or frozen liquids such as frozen ice pops.  Gargle with salt water (mix 1 tsp salt with 8 oz of water).  Do not smoke and avoid secondhand smoke.  Put a cool-mist humidifier in your bedroom at night to moisten the air. You can also turn on a hot shower and sit in the bathroom with the door closed for 5-10 minutes. SEEK IMMEDIATE MEDICAL CARE IF:  You have difficulty breathing.  You are unable to swallow fluids, soft foods, or your saliva.  You have increased swelling in the throat.  Your sore throat does not get better in 7 days.  You have nausea and vomiting.  You have a fever or persistent symptoms for more than 2-3 days.  You have a fever and your symptoms suddenly get worse. MAKE SURE YOU:   Understand  these instructions.  Will watch your condition.  Will get help right away if you are not doing well or get worse.   This information is not intended to replace advice given to you by your health care provider. Make sure you discuss any questions you have with your health care provider.   Document Released: 10/21/2004 Document Revised: 10/04/2014 Document Reviewed: 05/21/2012 Elsevier Interactive Patient Education 2016 Elsevier Inc. Codeine; Guaifenesin oral solution or syrup What is this medicine? CODEINE; GUAIFENESIN (KOE deen; gwye FEN e sin) is a cough suppressant and expectorant. It helps to stop or reduce coughing due to the common cold or inhaled irritants. It will not treat an infection. This medicine may be used for other purposes; ask your health care provider or pharmacist if you have questions. What should I tell my health care provider before I take this medicine? They need to know if you have any of these conditions: -Addison's disease -convulsions -drug or alcohol abuse or addiction -enlarged prostate -fever -intestinal or stomach problems -kidney disease -liver disease -lung disease like asthma or emphysema -recent surgery or injury -thyroid disease -an allergic or unusual reaction to codeine, hydromorphone, hydrocodone, oxycodone, morphine, guaifenesin, other medicines, foods, dyes, or preservatives -pregnant or trying to get pregnant -breast-feeding How should I use this medicine?  Take this medicine by mouth with a full glass of water. Follow the directions on the prescription label. Use a specially marked spoon or container to measure your medicine.  Ask your pharmacist if you do not have one. Household spoons are not accurate. Take this medicine with food or milk if it upsets your stomach. Take your doses at regular times. Do not take more medicine than directed. Talk to your pediatrician regarding the use of this medicine in children. Special care may be  needed. Overdosage: If you think you have taken too much of this medicine contact a poison control center or emergency room at once. NOTE: This medicine is only for you. Do not share this medicine with others.  What if I miss a dose?  If you miss a dose, take it as soon as you can. If it is almost time for your next dose, take only that dose. Do not take double or extra doses.  What may interact with this medicine? -alcohol -antihistamines for allergy, cough and cold -certain medicines for depression, anxiety, or psychotic disturbances -certain medicines for sleep -MAOIs like Carbex, Eldepryl, Marplan, Nardil, and Parnate -muscle relaxants -narcotic medicines (opiates) for pain -tramadol This list may not describe all possible interactions. Give your health care provider a list of all the medicines, herbs, non-prescription drugs, or dietary supplements you use. Also tell them if you smoke, drink alcohol, or use illegal drugs. Some items may interact with your medicine. What should I watch for while using this medicine? You may develop tolerance to this medicine if you take it for a long time. Tolerance means that you will get less cough relief with time. Tell your doctor or health care professional if your symptoms do not improve or if they get worse. If you have a high fever, skin rash, or headache, see your health care professional. Do not suddenly stop taking your medicine because you may develop a severe reaction. Your body becomes used to the medicine. This does NOT mean you are addicted. Addiction is a behavior related to getting and using a drug for a non-medical reason. If your doctor wants you to stop the medicine, the dose will be slowly lowered over time to avoid any side effects. Drink several glasses of water each day. You may get drowsy or dizzy. Do not drive, use machinery, or do anything that needs mental alertness until you know how this medicine affects you. Do not stand or  sit up quickly, especially if you are an older patient. This reduces the risk of dizzy or fainting spells. Alcohol may interfere with the effect of this medicine. Avoid alcoholic drinks. Children may be at higher risk for side effects. If your child has slow breathing, noisy breathing, confusion, or unusual sleepiness, stop giving this medicine and get medical help right away. The medicine may cause constipation. Try to have a bowel movement at least every 2 to 3 days. If you do not have a bowel movement for 3 days, call your doctor or health care professional. What side effects may I notice from receiving this medicine? Side effects that you should report to your doctor or health care professional as soon as possible: -allergic reactions like skin rash, itching or hives, swelling of the face, lips, or tongue -breathing problems -confusion -hallucinations -irregular heartbeat -seizures -trouble passing urine Side effects that usually do not require medical attention (report to your doctor or health care professional if they continue or are bothersome): -blurred vision -constipation -flushing or sweating -headache -stomach upset, nausea This list may not describe all possible side effects. Call your doctor for medical advice about side effects. You may report side effects to FDA  at 1-800-FDA-1088. Where should I keep my medicine? Keep out of the reach of children. This medicine can be abused. Keep your medicine in a safe place to protect it from theft. Do not share this medicine with anyone. Selling or giving away this medicine is dangerous and against the law. This medicine may cause accidental overdose and death if taken by other adults, children, or pets. Mix any unused medicine with a substance like cat littler or coffee grounds. Then throw the medicine away in a sealed container like a sealed bag or a coffee can with a lid. Do not use the medicine after the expiration date. Store at room  temperature between 15 and 30 degrees C (59 and 86 degrees F). Protect from light. NOTE: This sheet is a summary. It may not cover all possible information. If you have questions about this medicine, talk to your doctor, pharmacist, or health care provider.    2016, Elsevier/Gold Standard. (2014-05-18 18:27:27)

## 2015-07-29 NOTE — ED Provider Notes (Signed)
CSN: [id number]     Arrival date & time 07/29/15  1134 History  By signing my name below, I, Ronney Lion, attest that this documentation has been prepared under the direction and in the presence of Arthor Captain, PA-C. Electronically Signed: Ronney Lion, ED Scribe. 07/29/2015. 1:03 PM.    Chief Complaint  Patient presents with  . Sore Throat   Patient is a 39 y.o. female presenting with pharyngitis. The history is provided by the patient. A language interpreter was used (patient's friend on the phone Rulon Eisenmenger)).  Sore Throat This is a new problem. The current episode started more than 2 days ago. The problem occurs constantly. The problem has been gradually worsening. Associated symptoms include headaches. The symptoms are aggravated by swallowing. Nothing relieves the symptoms. She has tried nothing for the symptoms.    HPI Comments: Angela Welch is a 39 y.o. female brought in by ambulance, who presents to the Emergency Department complaining of a gradual-onset, constant sore throat that began 3 days ago. She also endorses a subjective fever, headache, and ear pain. Patient reports increased pain with swallowing. She states she is able to tolerate swallowing fluids, including water and her saliva, but she has not been drinking as much water as usual. She has not taken any treatments or medications for her pain. She denies any dental pain.   History reviewed. No pertinent past medical history. History reviewed. No pertinent past surgical history. History reviewed. No pertinent family history. Social History  Substance Use Topics  . Smoking status: Never Smoker   . Smokeless tobacco: None  . Alcohol Use: No   OB History    No data available     Review of Systems  Constitutional: Positive for fever (subjective).  HENT: Positive for ear pain and sore throat. Negative for dental problem and trouble swallowing.   Neurological: Positive for headaches.    Allergies  Review of patient's  allergies indicates no known allergies.  Home Medications   Prior to Admission medications   Medication Sig Start Date End Date Taking? Authorizing Provider  amoxicillin (AMOXIL) 500 MG capsule Take 1 capsule (500 mg total) by mouth 2 (two) times daily. 04/02/15   Charm Rings, MD  cetirizine (ZYRTEC) 10 MG tablet Take 1 tablet (10 mg total) by mouth daily. 04/02/15   Charm Rings, MD  fluticasone (FLONASE) 50 MCG/ACT nasal spray Place 2 sprays into both nostrils daily. 04/02/15   Charm Rings, MD  meloxicam (MOBIC) 7.5 MG tablet Take 1 tablet (7.5 mg total) by mouth 2 (two) times daily after a meal. 05/15/15   Linna Hoff, MD  naproxen (NAPROSYN) 375 MG tablet Take 1 tablet (375 mg total) by mouth 2 (two) times daily. 06/09/15   Hayden Rasmussen, NP  phenol (CHLORASEPTIC) 1.4 % LIQD Use as directed 1 spray in the mouth or throat as needed for throat irritation / pain. 05/22/15   Robyn M Hess, PA-C   BP 121/81 mmHg  Pulse 112  Temp(Src) 98.6 F (37 C) (Oral)  SpO2 100% Physical Exam  Constitutional: She is oriented to person, place, and time. She appears well-developed and well-nourished. No distress.  HENT:  Head: Normocephalic and atraumatic.  Right Ear: Tympanic membrane and external ear normal.  Left Ear: Tympanic membrane and external ear normal.  Mouth/Throat: Posterior oropharyngeal erythema present.  Mild post oropharyngeal erythema. Bilateral TMs are normal.   Eyes: Conjunctivae and EOM are normal.  Neck: Neck supple. Erythema present. No tracheal deviation present.  Cardiovascular: Normal rate.   Pulmonary/Chest: Effort normal. No respiratory distress.  Musculoskeletal: Normal range of motion.  Lymphadenopathy:  Mild cervical adenopathy.  Neurological: She is alert and oriented to person, place, and time.  Skin: Skin is warm and dry.  Psychiatric: She has a normal mood and affect. Her behavior is normal.  Nursing note and vitals reviewed.   ED Course  Procedures (including  critical care time)  DIAGNOSTIC STUDIES: Oxygen Saturation is 100% on RA, normal by my interpretation.    COORDINATION OF CARE: 12:55 PM - Negative strep screen discussed with pt. Suspect viral infection. Discussed treatment plan with pt at bedside which includes symptomatic control with pain medicine, throat lozenges, Tylenol, and ibuprofen. Advised pt to push fluids. Pt verbalized understanding and agreed to plan.   Labs Review Labs Reviewed  RAPID STREP SCREEN (NOT AT [facility])  CULTURE, GROUP A STREP    MDM   Final diagnoses:  Pharyngitis   Pt afebrile without tonsillar exudate, negative strep. Presents with mild cervical lymphadenopathy, & dysphagia; diagnosis of viral pharyngitis. No abx indicated. DC w symptomatic tx for pain  Pt does not appear dehydrated, but did discuss importance of water rehydration. Presentation non concerning for PTA or infxn spread to soft tissue. No trismus or uvula deviation. Specific return precautions discussed. Pt able to drink water in ED without difficulty with intact air way. Recommended PCP follow up.   I personally performed the services described in this documentation, which was scribed in my presence. The recorded information has been reviewed and is accurate.       Arthor Captainbigail Decarlos Empey, PA-C 07/29/15 1316  Linwood DibblesJon Knapp, MD 07/30/15 [phone]

## 2015-07-29 NOTE — ED Notes (Signed)
Per EMS. Pt from home. Pt has had a sore throat since Friday. Has been able to swallow.

## 2015-07-31 LAB — CULTURE, GROUP A STREP: Strep A Culture: NEGATIVE

## 2015-08-18 ENCOUNTER — Other Ambulatory Visit: Payer: Self-pay | Admitting: Nurse Practitioner

## 2015-09-25 ENCOUNTER — Emergency Department (HOSPITAL_COMMUNITY)
Admission: EM | Admit: 2015-09-25 | Discharge: 2015-09-25 | Disposition: A | Discharge status: Home / Self Care | Payer: Medicaid Other | Attending: Emergency Medicine | Admitting: Emergency Medicine

## 2015-09-25 ENCOUNTER — Encounter (HOSPITAL_COMMUNITY): Payer: Self-pay | Admitting: Emergency Medicine

## 2015-09-25 ENCOUNTER — Emergency Department (HOSPITAL_COMMUNITY): Payer: Medicaid Other

## 2015-09-25 DIAGNOSIS — G44229 Chronic tension-type headache, not intractable: Secondary | ICD-10-CM | POA: Diagnosis not present

## 2015-09-25 DIAGNOSIS — R51 Headache: Secondary | ICD-10-CM | POA: Diagnosis present

## 2015-09-25 DIAGNOSIS — G8929 Other chronic pain: Secondary | ICD-10-CM | POA: Insufficient documentation

## 2015-09-25 DIAGNOSIS — R0789 Other chest pain: Secondary | ICD-10-CM | POA: Insufficient documentation

## 2015-09-25 LAB — BASIC METABOLIC PANEL
Anion gap: 11 (ref 5–15)
BUN: 6 mg/dL (ref 6–20)
CHLORIDE: 107 mmol/L (ref 101–111)
CO2: 24 mmol/L (ref 22–32)
Calcium: 10.5 mg/dL — ABNORMAL HIGH (ref 8.9–10.3)
Creatinine, Ser: 0.67 mg/dL (ref 0.44–1.00)
GFR calc Af Amer: >60 mL/min (ref >60)
GFR calc non Af Amer: >60 mL/min (ref >60)
GLUCOSE: 93 mg/dL (ref 65–99)
POTASSIUM: 4.1 mmol/L (ref 3.5–5.1)
Sodium: 142 mmol/L (ref 135–145)

## 2015-09-25 LAB — CBC
HEMATOCRIT: 38.3 % (ref 36.0–46.0)
Hemoglobin: 13 g/dL (ref 12.0–15.0)
MCH: 28.1 pg (ref 26.0–34.0)
MCHC: 33.9 g/dL (ref 30.0–36.0)
MCV: 82.7 fL (ref 78.0–100.0)
Platelets: 228 10*3/uL (ref 150–400)
RBC: 4.63 MIL/uL (ref 3.87–5.11)
RDW: 12.9 % (ref 11.5–15.5)
WBC: 5 10*3/uL (ref 4.0–10.5)

## 2015-09-25 LAB — I-STAT TROPONIN, ED: Troponin i, poc: 0 ng/mL (ref 0.00–0.08)

## 2015-09-25 MED ORDER — MORPHINE SULFATE (PF) 4 MG/ML IV SOLN
6.0000 mg | Freq: Once | INTRAVENOUS | Status: AC
  Administered 2015-09-25: 6 mg via INTRAVENOUS
  Filled 2015-09-25: qty 2

## 2015-09-25 MED ORDER — IBUPROFEN 800 MG PO TABS: 800.0000 mg | ORAL_TABLET | Freq: Three times a day (TID) | ORAL | Status: DC

## 2015-09-25 MED ORDER — METHOCARBAMOL 500 MG PO TABS: 500.0000 mg | ORAL_TABLET | Freq: Two times a day (BID) | ORAL | Status: DC

## 2015-09-25 MED ORDER — ONDANSETRON HCL 4 MG/2ML IJ SOLN
4.0000 mg | Freq: Once | INTRAMUSCULAR | Status: AC
  Administered 2015-09-25: 4 mg via INTRAVENOUS
  Filled 2015-09-25: qty 2

## 2015-09-25 NOTE — Discharge Instructions (Signed)
Chest Wall Pain Chest wall pain is pain in or around the bones and muscles of your chest. Sometimes, an injury causes this pain. Sometimes, the cause may not be known. This pain may take several weeks or longer to get better. HOME CARE INSTRUCTIONS  Pay attention to any changes in your symptoms. Take these actions to help with your pain:   Rest as told by your health care provider.   Avoid activities that cause pain. These include any activities that use your chest muscles or your abdominal and side muscles to lift heavy items.   If directed, apply ice to the painful area:  Put ice in a plastic bag.  Place a towel between your skin and the bag.  Leave the ice on for 20 minutes, 2-3 times per day.  Take over-the-counter and prescription medicines only as told by your health care provider.  Do not use tobacco products, including cigarettes, chewing tobacco, and e-cigarettes. If you need help quitting, ask your health care provider.  Keep all follow-up visits as told by your health care provider. This is important. SEEK MEDICAL CARE IF:  You have a fever.  Your chest pain becomes worse.  You have new symptoms. SEEK IMMEDIATE MEDICAL CARE IF:  You have nausea or vomiting.  You feel sweaty or light-headed.  You have a cough with phlegm (sputum) or you cough up blood.  You develop shortness of breath.   This information is not intended to replace advice given to you by your health care provider. Make sure you discuss any questions you have with your health care provider.   Document Released: 09/13/2005 Document Revised: 06/04/2015 Document Reviewed: 12/09/2014 Elsevier Interactive Patient Education 2016 Elsevier Inc.  General Headache Without Cause A headache is pain or discomfort felt around the head or neck area. There are many causes and types of headaches. In some cases, the cause may not be found.  HOME CARE  Managing Pain  Take over-the-counter and prescription  medicines only as told by your doctor.  Lie down in a dark, quiet room when you have a headache.  If directed, apply ice to the head and neck area:  Put ice in a plastic bag.  Place a towel between your skin and the bag.  Leave the ice on for 20 minutes, 2-3 times per day.  Use a heating pad or hot shower to apply heat to the head and neck area as told by your doctor.  Keep lights dim if bright lights bother you or make your headaches worse. Eating and Drinking  Eat meals on a regular schedule.  Lessen how much alcohol you drink.  Lessen how much caffeine you drink, or stop drinking caffeine. General Instructions  Keep all follow-up visits as told by your doctor. This is important.  Keep a journal to find out if certain things bring on headaches. For example, write down:  What you eat and drink.  How much sleep you get.  Any change to your diet or medicines.  Relax by getting a massage or doing other relaxing activities.  Lessen stress.  Sit up straight. Do not tighten (tense) your muscles.  Do not use tobacco products. This includes cigarettes, chewing tobacco, or e-cigarettes. If you need help quitting, ask your doctor.  Exercise regularly as told by your doctor.  Get enough sleep. This often means 7-9 hours of sleep. GET HELP IF:  Your symptoms are not helped by medicine.  You have a headache that feels different than  the other headaches.  You feel sick to your stomach (nauseous) or you throw up (vomit).  You have a fever. GET HELP RIGHT AWAY IF:   Your headache becomes really bad.  You keep throwing up.  You have a stiff neck.  You have trouble seeing.  You have trouble speaking.  You have pain in the eye or ear.  Your muscles are weak or you lose muscle control.  You lose your balance or have trouble walking.  You feel like you will pass out (faint) or you pass out.  You have confusion.   This information is not intended to replace  advice given to you by your health care provider. Make sure you discuss any questions you have with your health care provider.   Document Released: 06/22/2008 Document Revised: 06/04/2015 Document Reviewed: 01/06/2015 Elsevier Interactive Patient Education [employer] Elsevier Inc.

## 2015-09-25 NOTE — ED Provider Notes (Signed)
CSN: [id number]     Arrival date & time 09/25/15  1420 History   First MD Initiated Contact with Patient 09/25/15 1503     Chief Complaint  Patient presents with  . Headache  . Chest Pain     (Consider location/radiation/quality/duration/timing/severity/associated sxs/prior Treatment) HPI   39 year old Swahili speaking female brought here via EMS for evaluation of CP.  Pt has gradual onset of recurrent sinus headache with sensation of drainage and congestion to her sinus, follows with nausea, dizziness, weakness and persistent chest pain.  Her chest pain is moderate in severity, Left side and occasional radiates to her back.  She feels weak, and nothing seems to makes her sxs better or worse.  No specific treatment tried. She has had similar sxs for the past 3-4 years which usually has to run its course.  She denies fever, URI sxs, hearing changes, vision changes, neck stiffness, SOB, productive cough, abd pain, v/d, dysuria, focal numbness or rash.  She recently moved to [place] 7 months ago.    History reviewed. No pertinent past medical history. History reviewed. No pertinent past surgical history. No family history on file. Social History  Substance Use Topics  . Smoking status: Never Smoker   . Smokeless tobacco: None  . Alcohol Use: No   OB History    No data available     Review of Systems  All other systems reviewed and are negative.     Allergies  Review of patient's allergies indicates no known allergies.  Home Medications   Prior to Admission medications   Not on File   BP 134/91 mmHg  Pulse 84  Temp(Src) 97.7 F (36.5 C) (Oral)  Resp 20  SpO2 100% Physical Exam  Constitutional: She appears well-developed and well-nourished. No distress.  AAF, non toxic, but appears uncomfortable.   HENT:  Head: Atraumatic.  Right Ear: External ear normal.  Left Ear: External ear normal.  Nose: Nose normal.  Mouth/Throat: Oropharynx is clear and moist.  Eyes:  Conjunctivae and EOM are normal. Pupils are equal, round, and reactive to light.  Neck: Normal range of motion. Neck supple.  No nuchal rigidity  Cardiovascular: Normal rate and regular rhythm.  Exam reveals no gallop and no friction rub.   No murmur heard. Pulmonary/Chest: Effort normal and breath sounds normal. She exhibits tenderness (mild anterior chest wall pain, no crepitus or emphysema.  No rash).  Abdominal: Soft. There is no tenderness.  Musculoskeletal: She exhibits no edema.  Neurological: She is alert. She has normal strength. No cranial nerve deficit or sensory deficit. GCS eye subscore is 4. GCS verbal subscore is 5. GCS motor subscore is 6.  Skin: No rash noted.  Psychiatric: She has a normal mood and affect.  Nursing note and vitals reviewed.   ED Course  Procedures (including critical care time) Labs Review Labs Reviewed  BASIC METABOLIC PANEL - Abnormal; Notable for the following:    Calcium 10.5 (*)    All other components within normal limits  CBC  I-STAT TROPOININ, ED    Imaging Review Dg Chest 2 View  09/25/2015  CLINICAL DATA:  Initial encounter for 3 day history of chest pain radiating to the face and ears. EXAM: CHEST  2 VIEW COMPARISON:  05/22/2015. FINDINGS: The heart size and mediastinal contours are within normal limits. Both lungs are clear. The visualized skeletal structures are unremarkable. IMPRESSION: No active cardiopulmonary disease. Electronically Signed   By: Kennith Center M.D.   On: 09/25/2015  16:07   Ct Head Wo Contrast  09/25/2015  CLINICAL DATA:  Initial encounter for chest pain and headache for 3 days. EXAM: CT HEAD WITHOUT CONTRAST TECHNIQUE: Contiguous axial images were obtained from the base of the skull through the vertex without intravenous contrast. COMPARISON:  None. FINDINGS: There is no evidence for acute hemorrhage, hydrocephalus, mass lesion, or abnormal extra-axial fluid collection. No definite CT evidence for acute infarction.  The visualized paranasal sinuses and mastoid air cells are clear. IMPRESSION: Normal CT evaluation of the brain. Electronically Signed   By: Kennith CenterEric  Mansell M.D.   On: 09/25/2015 16:14   I have personally reviewed and evaluated these images and lab results as part of my medical decision-making.   EKG Interpretation None     ED ECG REPORT   Date: 09/25/2015  Rate: 82  Rhythm: normal sinus rhythm  QRS Axis: normal  Intervals: PR prolonged  ST/T Wave abnormalities: normal  Conduction Disutrbances:none  Narrative Interpretation:   Old EKG Reviewed: unchanged  I have personally reviewed the EKG tracing and agree with the computerized printout as noted.   MDM   Final diagnoses:  Chronic tension-type headache, not intractable  Chest wall pain, chronic    BP 122/79 mmHg  Pulse 73  Temp(Src) 97.7 F (36.5 C) (Oral)  Resp 16  SpO2 98%  LMP    3:28 PM Pt with recurrent L sided CP and headache for the past 3 years.  No significant cardiac hx, non smoker.  Does not have URI sxs.  Work up initiated.    5:22 PM Patient does not have any significant risk factor for ACS. Her heart score is 0. Her labs are reassuring. Chest x-ray is unremarkable. Normal head CT scan. Patient has been seen in the ED for the same complaint for the past 6 prior visits. No acute finding on today's exam. She will benefit from further evaluation by her primary care doctor for her complaint.  5:36 PM Patient reports symptoms improving. She does have a primary care provider which she will follow-up. Symptomatic treatment provided. Will prescribe ibuprofen and Robaxin to use as needed. Return precaution discussed.  Fayrene HelperBowie Falesha Schommer, PA-C 09/25/15 1736  Margarita Grizzleanielle Ray, MD 09/27/15 [phone]

## 2015-09-25 NOTE — ED Notes (Addendum)
Per EMS, unable to fully assess pt du to language barrier. Pt speaks swahili. Upon speaking  To pt with interpretor phone pt reports CP and headache which radiates from her forehead to her nose and ears x 3 days. Pt also reports increased light headedness with standing. Pt alert x4.

## 2015-09-25 NOTE — ED Notes (Signed)
Pt ambulatory home stable with family. After vebalizes understanding of instructions through interpretor.

## 2016-02-23 ENCOUNTER — Emergency Department (HOSPITAL_COMMUNITY)
Admission: EM | Admit: 2016-02-23 | Discharge: 2016-02-24 | Disposition: A | Discharge status: Home / Self Care | Payer: Medicaid Other | Attending: Emergency Medicine | Admitting: Emergency Medicine

## 2016-02-23 ENCOUNTER — Encounter (HOSPITAL_COMMUNITY): Payer: Self-pay

## 2016-02-23 DIAGNOSIS — R519 Headache, unspecified: Secondary | ICD-10-CM

## 2016-02-23 DIAGNOSIS — R51 Headache: Secondary | ICD-10-CM | POA: Insufficient documentation

## 2016-02-23 DIAGNOSIS — H5710 Ocular pain, unspecified eye: Secondary | ICD-10-CM | POA: Insufficient documentation

## 2016-02-23 DIAGNOSIS — R0981 Nasal congestion: Secondary | ICD-10-CM | POA: Insufficient documentation

## 2016-02-23 DIAGNOSIS — M542 Cervicalgia: Secondary | ICD-10-CM | POA: Insufficient documentation

## 2016-02-23 DIAGNOSIS — N390 Urinary tract infection, site not specified: Secondary | ICD-10-CM | POA: Insufficient documentation

## 2016-02-23 DIAGNOSIS — R42 Dizziness and giddiness: Secondary | ICD-10-CM | POA: Insufficient documentation

## 2016-02-23 LAB — CBC WITH DIFFERENTIAL/PLATELET
BASOS ABS: 0 10*3/uL (ref 0.0–0.1)
Basophils Relative: 1 %
EOS PCT: 2 %
Eosinophils Absolute: 0.1 10*3/uL (ref 0.0–0.7)
HCT: 36 % (ref 36.0–46.0)
Hemoglobin: 12.1 g/dL (ref 12.0–15.0)
LYMPHS PCT: 56 %
Lymphs Abs: 2.3 10*3/uL (ref 0.7–4.0)
MCH: 27.1 pg (ref 26.0–34.0)
MCHC: 33.6 g/dL (ref 30.0–36.0)
MCV: 80.5 fL (ref 78.0–100.0)
Monocytes Absolute: 0.2 10*3/uL (ref 0.1–1.0)
Monocytes Relative: 6 %
Neutro Abs: 1.4 10*3/uL — ABNORMAL LOW (ref 1.7–7.7)
Neutrophils Relative %: 35 %
PLATELETS: 218 10*3/uL (ref 150–400)
RBC: 4.47 MIL/uL (ref 3.87–5.11)
RDW: 12.5 % (ref 11.5–15.5)
WBC: 4 10*3/uL (ref 4.0–10.5)

## 2016-02-23 LAB — I-STAT CHEM 8, ED
BUN: 7 mg/dL (ref 6–20)
CHLORIDE: 109 mmol/L (ref 101–111)
Calcium, Ion: 1.2 mmol/L (ref 1.12–1.23)
Creatinine, Ser: 0.7 mg/dL (ref 0.44–1.00)
GLUCOSE: 97 mg/dL (ref 65–99)
HCT: 37 % (ref 36.0–46.0)
Hemoglobin: 12.6 g/dL (ref 12.0–15.0)
POTASSIUM: 3.6 mmol/L (ref 3.5–5.1)
SODIUM: 141 mmol/L (ref 135–145)
TCO2: 20 mmol/L (ref 0–100)

## 2016-02-23 MED ORDER — SODIUM CHLORIDE 0.9 % IV BOLUS (SEPSIS)
1000.0000 mL | Freq: Once | INTRAVENOUS | Status: AC
  Administered 2016-02-23: 1000 mL via INTRAVENOUS

## 2016-02-23 MED ORDER — ONDANSETRON HCL 4 MG/2ML IJ SOLN
4.0000 mg | Freq: Once | INTRAMUSCULAR | Status: AC
  Administered 2016-02-23: 4 mg via INTRAVENOUS
  Filled 2016-02-23: qty 2

## 2016-02-23 MED ORDER — KETOROLAC TROMETHAMINE 30 MG/ML IJ SOLN
30.0000 mg | Freq: Once | INTRAMUSCULAR | Status: AC
  Administered 2016-02-23: 30 mg via INTRAVENOUS
  Filled 2016-02-23: qty 1

## 2016-02-23 NOTE — ED Notes (Signed)
Per EMS: Pt speaks swahili. Not feeling well x 2 days. Complaining of chest, neck and head pain. 324 ASA and 1 nitro.

## 2016-02-23 NOTE — ED Provider Notes (Addendum)
CSN: [id number]     Arrival date & time 02/23/16  2132 History   First MD Initiated Contact with Patient 02/23/16 2133     Chief Complaint  Patient presents with  . Headache     (Consider location/radiation/quality/duration/timing/severity/associated sxs/prior Treatment) HPI Comments: This a 40 year old female with some vague symptoms of headache, dizziness, congestion, some pain in her eyes.  She states that she's had this little bump next to her eye for "a while."  She has not taken anything for her discomfort.  Denies any nausea, vomiting, diarrhea, dysuria, abdominal pain, chest pain, back pain, cough, seasonal allergies, difficulty ambulating.  Patient is a 40 y.o. female presenting with headaches. The history is provided by the patient. The history is limited by a language barrier. A language interpreter was used.  Headache Pain location:  Generalized Quality:  Unable to specify Radiates to:  Does not radiate Severity currently:  Unable to specify Severity at highest:  Unable to specify Onset quality:  Gradual Duration:  2 days Timing:  Constant Progression:  Unchanged Chronicity:  Recurrent Similar to prior headaches: yes   Relieved by:  None tried Worsened by:  Nothing Ineffective treatments:  None tried Associated symptoms: congestion, dizziness, eye pain and neck pain   Associated symptoms: no abdominal pain, no back pain, no blurred vision, no cough, no diarrhea, no facial pain, no fatigue, no fever, no focal weakness, no hearing loss, no loss of balance, no myalgias, no nausea, no near-syncope, no paresthesias, no photophobia, no sore throat, no visual change, no vomiting and no weakness   Congestion:    Location:  Nasal Dizziness:    Severity:  Unable to specify   Duration:  2 days   History reviewed. No pertinent past medical history. History reviewed. No pertinent past surgical history. History reviewed. No pertinent family history. Social History  Substance  Use Topics  . Smoking status: Never Smoker   . Smokeless tobacco: None  . Alcohol Use: No   OB History    No data available     Review of Systems  Constitutional: Negative for fever and fatigue.  HENT: Positive for congestion. Negative for dental problem, hearing loss, rhinorrhea, sore throat and trouble swallowing.   Eyes: Positive for pain. Negative for blurred vision and photophobia.  Respiratory: Negative for cough.   Cardiovascular: Negative for chest pain and near-syncope.  Gastrointestinal: Negative for nausea, vomiting, abdominal pain and diarrhea.  Genitourinary: Negative for dysuria and menstrual problem.  Musculoskeletal: Positive for neck pain. Negative for myalgias and back pain.  Neurological: Positive for dizziness and headaches. Negative for focal weakness, weakness, paresthesias and loss of balance.  All other systems reviewed and are negative.     Allergies  Review of patient's allergies indicates no known allergies.  Home Medications   Prior to Admission medications   Medication Sig Start Date End Date Taking? Authorizing Provider  sulfamethoxazole-trimethoprim (BACTRIM DS,SEPTRA DS) 800-160 MG tablet Take 1 tablet by mouth 2 (two) times daily. 02/24/16   Earley Favor, NP   BP 117/90 mmHg  Pulse 71  Resp 14  SpO2 100% Physical Exam  Constitutional: She is oriented to person, place, and time. She appears well-developed and well-nourished.  HENT:  Head: Normocephalic.  Right Ear: External ear normal.  Left Ear: External ear normal.  Eyes: Pupils are equal, round, and reactive to light.  Neck: Normal range of motion.  Cardiovascular: Normal rate and regular rhythm.   Pulmonary/Chest: Effort normal.  Musculoskeletal: Normal range of motion.  Neurological: She is alert and oriented to person, place, and time.  Skin: Skin is warm.  Nursing note and vitals reviewed.   ED Course  Procedures (including critical care time) Labs Review Labs Reviewed  CBC  WITH DIFFERENTIAL/PLATELET - Abnormal; Notable for the following:    Neutro Abs 1.4 (*)    All other components within normal limits  URINALYSIS, ROUTINE W REFLEX MICROSCOPIC (NOT AT [facility]) - Abnormal; Notable for the following:    Color, Urine STRAW (*)    APPearance CLOUDY (*)    Hgb urine dipstick LARGE (*)    Leukocytes, UA LARGE (*)    All other components within normal limits  URINE MICROSCOPIC-ADD ON - Abnormal; Notable for the following:    Squamous Epithelial / LPF TOO NUMEROUS TO COUNT (*)    Bacteria, UA MANY (*)    All other components within normal limits  I-STAT CHEM 8, ED    Imaging Review No results found. I have personally reviewed and evaluated these images and lab results as part of my medical decision-making.   EKG Interpretation None    evaluated for HA with vague symptoms, I find in women that UTI is most likely the culpurate  And this patient I was correct she will be treated with antibiotics  MDM   Final diagnoses:  Nonintractable headache, unspecified chronicity pattern, unspecified headache type  UTI (lower urinary tract infection)         Earley FavorGail Vikki Gains, NP 02/24/16 2001  Alvira MondayErin Schlossman, MD 02/25/16 1322  Earley FavorGail Kensley Valladares, NP 04/14/16 [id number]  Alvira MondayErin Schlossman, MD 04/17/16 1839  Earley FavorGail Shamecca Whitebread, NP 04/19/16 1944  Alvira MondayErin Schlossman, MD 04/22/16 0028

## 2016-02-23 NOTE — ED Notes (Signed)
Pt complaining of head, neck and chest pain. NP at bedside with interpreter phone.

## 2016-02-24 LAB — URINALYSIS, ROUTINE W REFLEX MICROSCOPIC
BILIRUBIN URINE: NEGATIVE
Glucose, UA: NEGATIVE mg/dL
Ketones, ur: NEGATIVE mg/dL
NITRITE: NEGATIVE
Protein, ur: NEGATIVE mg/dL
SPECIFIC GRAVITY, URINE: 1.009 (ref 1.005–1.030)
pH: 7.5 (ref 5.0–8.0)

## 2016-02-24 LAB — URINE MICROSCOPIC-ADD ON

## 2016-02-24 MED ORDER — SULFAMETHOXAZOLE-TRIMETHOPRIM 800-160 MG PO TABS
1.0000 | ORAL_TABLET | Freq: Once | ORAL | Status: AC
  Administered 2016-02-24: 1 via ORAL
  Filled 2016-02-24: qty 1

## 2016-02-24 MED ORDER — SULFAMETHOXAZOLE-TRIMETHOPRIM 800-160 MG PO TABS: 1.0000 | ORAL_TABLET | Freq: Two times a day (BID) | ORAL | Status: DC

## 2016-02-24 NOTE — Discharge Instructions (Signed)
kazi damu yako inaonyesha kwamba huna maradhi ya [place], lakini mkojo wako anafanya show maambukizi ya mkojo. Hii ni kuwa kutibiwa na dawa iitwayo Septra. Tafadhali kuchukua kama ilivyoagizwa hadi vidonge kila zimechukuliwa na kioo kamili ya [place]. Wewe pia amepewa rufaa kwa afya ya jamii kwa kukusaidia Bh[place]uanzisha huduma za msingi daktari   Your blood work shows that you do not have a systemic infection, but your urine does show a urinary tract infection.  This is being treated with a medication called Septra.  Please take this as directed until all tablets have been taken with a full glass of water.  You have also been given a referral to community wellness to help you establish a primary care physician

## 2016-03-03 ENCOUNTER — Encounter (HOSPITAL_COMMUNITY): Payer: Self-pay

## 2016-03-03 ENCOUNTER — Encounter: Payer: Self-pay | Admitting: Internal Medicine

## 2016-03-03 ENCOUNTER — Ambulatory Visit (INDEPENDENT_AMBULATORY_CARE_PROVIDER_SITE_OTHER): Payer: Self-pay | Admitting: Internal Medicine

## 2016-03-03 VITALS — BP 110/62 | HR 78 | Resp 14 | Ht 62.75 in | Wt 147.0 lb

## 2016-03-03 DIAGNOSIS — K219 Gastro-esophageal reflux disease without esophagitis: Secondary | ICD-10-CM

## 2016-03-03 DIAGNOSIS — Z658 Other specified problems related to psychosocial circumstances: Secondary | ICD-10-CM

## 2016-03-03 DIAGNOSIS — R1900 Intra-abdominal and pelvic swelling, mass and lump, unspecified site: Secondary | ICD-10-CM

## 2016-03-03 DIAGNOSIS — G44221 Chronic tension-type headache, intractable: Secondary | ICD-10-CM

## 2016-03-03 DIAGNOSIS — F439 Reaction to severe stress, unspecified: Secondary | ICD-10-CM

## 2016-03-03 LAB — POCT URINE PREGNANCY: Preg Test, Ur: NEGATIVE

## 2016-03-03 MED ORDER — FAMOTIDINE 20 MG PO TABS: ORAL_TABLET | ORAL | Status: AC

## 2016-03-03 NOTE — Progress Notes (Signed)
Subjective:    Patient ID: Angela Welch, female    DOB: 1976-04-10, 40 y.o.   MRN: [id number]  HPI  New patient to establish.  Speaks Swahili. Originally from the [place]. [place]. Son,  Merci, is here to interpret.  1.  Headaches for 1 year--began before coming to [place]  Starts in nuchal area and then to top of head.  Describes squeezing pain.  Pain is constant.  States she has had constantly for 1 year.  Always nauseated with the pain, though has never vomited.   Sleeps 5 hours at night. Headache does not wake her from sleep.   No improvement in headache after waking.  Light can bother her when with headache, but not always. Has blurry vision with the headache. No aura or blurry vision before headache or without headache.   Noises bother her when with headache. Muscles in posterior neck hurt. Has stress, but she feels from chronic headaches. No fevers with headaches.    Has been seen at [facility].  CT of head was normal in 12.2016.  Currently with two separate charts, so could not find this initially.  States she is able to take care of her usual day activities, cooking, cleaning, childcare.  Has taken medication for the headache OTC, but it bothers her stomache, so she stops.   Pt. Was living in refugee camp in [place] years before [place] to [place]   Lost sister and friend in war. Living in the camp was difficult as well. Denies anything bad happening to her in the [place] or [place].  2.  Abdominal Issues:  Has Had belching and acid up into her throat throughout the day for 2 years.  Worsens after she eats. Pain in LUQ and midepigastric discomfort--not clear but sounds like she complains of gas Sometimes, lies down after eating. Has diarrhea, but only after drinking milk. No melena or hematochezia No coffee, no caffeine.   Does drink [employer], one daily. Not much intake of tomatoes or onions. No tobacco use. No alcohol use.  States she has lost 5 lbs  in past year. Has not been on medication for this since in [place]only in [place], but cannot recall what it was No history of EGD.  No history of abdominal or pelvic surgery.  Still with regular monthly periods, last was May 24th. No protection from pregnancy  Med:  None  Allergies:  NKDA  Will not add other history as need to merge this with previous chart.        Review of Systems     Objective:   Physical Exam  Appears not to feel well--constantly belching and shifting in seat HEENT:  PERRL, EOMI, discs sharp, TMs pearly gray, throat without injection, nasal mucosa swollen and with clear discharge Neck:  Supple, no adenopathy Chest:  CTA CV:  RRR with normal S1 and S2, NO S3, S4, or murmur, radial pulses normal and equal Abd:  S, Tenderness in RUQ and midepigastrium--mild, but abdomen firm in infraumbilical midline to left abdomen.  Cannot make out distinct borders of firmness.  No tympany to percussion over this area. GU:  Normal external genitalia:  No discharge, Unable to find fundus of uterus on bimanual exam with this large area of firmness as noted above.  No CMT.  No right adnexal mass or tenderness appreciated.      Assessment & Plan:  1.  Abdominal Mass:  [facility]  Check urine HCG(negative today) Then CT of ABdomen and pelvis To use condoms with intercourse until done.  2.  GERD:  Not certain if related to #1:  Start Famotidine at bedtime.  No lying down after eating.  3.  Tension Headaches.  Tylenol 1000 mg twice daily as needed.  PT referral for neck muscle tension.  Referral to Samul DadaN. Knight, LCSW to see if any stress reduction possible

## 2016-03-03 NOTE — Patient Instructions (Signed)
Tylenol 500 mg 2 tabs twice daily as needed for headache and neck pain

## 2016-03-05 ENCOUNTER — Telehealth: Payer: Self-pay | Admitting: Internal Medicine

## 2016-03-05 NOTE — Telephone Encounter (Signed)
Called patient to inform of upcoming appointment for CT. Unable to leave voice message . Per Cristy FriedlanderStacy Wright at [facility]- Patient has an appointment at [facility] -[address], [place] Telephone: [phone] On Monday, June 12th @ 11:45 AM. Someone, over the age of 40, will need to accompany patient to this appointment to interpret. Kennyth ArnoldStacy will also contact patient to provide appointment information

## 2016-03-11 NOTE — Telephone Encounter (Signed)
Patient has been informed by Cristy FriedlanderStacy Wright

## 2016-03-17 DIAGNOSIS — R519 Headache, unspecified: Secondary | ICD-10-CM

## 2016-03-17 DIAGNOSIS — R51 Headache: Principal | ICD-10-CM

## 2016-03-17 NOTE — Congregational Nurse Program (Signed)
Congregational Nurse Program Note  Date of Encounter: 03/17/2016  Past Medical History: No past medical history on file.  Encounter Details:     CNP Questionnaire - 03/17/16 1350    Patient Demographics   Is this a new or existing patient? New   Patient is considered a/an Refugee   Race African   Patient Assistance   Location of Patient Assistance Not Applicable   Patient's financial/insurance status Low Income;[place] Card/Care Connects   Uninsured Patient Yes   Patient referred to apply for the following financial assistance Not Applicable   Food insecurities addressed Not Applicable   Transportation assistance No   Assistance securing medications No   Educational health offerings Medications   Encounter Details   Primary purpose of visit Acute Illness/Condition Visit   Was an Emergency Department visit averted? Not Applicable   Does patient have a medical provider? No   Patient referred to Establish PCP   Was a mental health screening completed? (GAINS tool) No   Does patient have dental issues? No   Does patient have vision issues? No   Does your patient have an abnormal blood pressure today? No   Since previous encounter, have you referred patient for abnormal blood pressure that resulted in a new diagnosis or medication change? No   Does your patient have an abnormal blood glucose today? No   Since previous encounter, have you referred patient for abnormal blood glucose that resulted in a new diagnosis or medication change? No   Was there a life-saving intervention made? No         Amb Nursing Assessment - 03/03/16 1133    Pre-visit preparation   Pre-visit preparation completed Yes   Pain Assessment   Pain Assessment 0-10   Pain Score 5    Pain Type Chronic pain   Pain Location Head  stomach   Pain Descriptors / Indicators Headache;Crushing   Pain Onset More than a month ago   Pain Frequency Constant   Pain Relieving Factors nothing   Nutrition Screen   Nutritional Risks None   Diabetes No   Functional Status   Activities of Daily Living Independent   Ambulation Independent   Medication Administration Independent   Home Management Independent   Risk/Barriers  Assessment   Barriers to Care Management & Learning Language   Psychosocial Barriers Uninsured/under-insured   Abuse/Neglect Assessment   Do you feel unsafe in your current relationship? No   Do you feel physically threatened by others? No   Anyone hurting you at home, work, or school? No   Unable to ask? No   Patient Literacy   How often do you need to have someone help you when you read instructions, pamphlets, or other written materials from your doctor or pharmacy? 1 - Never   What is the last grade level you completed in school? 10th grade in [employer]   Interpreter Needed? Yes   Patient Declined Interpreter  No     Office visit at [employer] for this lady from [facility] [place] speaking Kinyarwanda, with complaint of chronic headaches. Denies other concerns. Describes headache today as surrounding eyes, over right ear top of head, base of skull and extending into spine. Unable to provide name of previous medications prescribed. Received medical treatment at [facility] and [facility]gent Care in past. Reluctant to return because of medical bills incurred. Unable to identify a PCP. No insurance card or documents present today. Medicaid expired. Possibly described coverage on [place]  Card. Requested assistance with securing a provider. [facility] (no appt.available until 04/10/16); [facility]. Use OTC medications i.e. Tylenol or Ibuprofen for headache relief until physician appointment. Return to office on 03/18/16 to show [employer] medical coverage. Refer to ophthalmologist in future once insurance established.  Ferol LuzMarietta Hajra Port, RN/CN

## 2016-03-19 ENCOUNTER — Telehealth: Payer: Self-pay | Admitting: Internal Medicine

## 2016-03-22 ENCOUNTER — Telehealth: Payer: Self-pay | Admitting: Licensed Clinical Social Worker

## 2016-03-22 NOTE — Telephone Encounter (Signed)
LCSW contacted pt and pt's son Merci in order to attempt scheduling counseling session. This was the third attempt over the past two weeks.

## 2016-03-23 NOTE — Congregational Nurse Program (Signed)
Congregational Nurse Program Note  Date of Encounter: 03/17/2016  Past Medical History: No past medical history on file.  Encounter Details:     CNP Questionnaire - 03/23/16 1401    Patient Demographics   Is this a new or existing patient? New   Patient is considered a/an Refugee   Race African   Patient Assistance   Location of Patient Assistance Not Applicable   Patient's financial/insurance status Low Income;[place] Connects   Uninsured Patient Yes   Patient referred to apply for the following financial assistance Not Applicable   Food insecurities addressed Not Applicable   Transportation assistance No   Assistance securing medications No   Educational health offerings Medications   Encounter Details   Primary purpose of visit Acute Illness/Condition Visit   Was an Emergency Department visit averted? Not Applicable   Does patient have a medical provider? No   Patient referred to Establish PCP   Was a mental health screening completed? (GAINS tool) No   Does patient have dental issues? No   Does patient have vision issues? No   Does your patient have an abnormal blood pressure today? No   Since previous encounter, have you referred patient for abnormal blood pressure that resulted in a new diagnosis or medication change? No   Does your patient have an abnormal blood glucose today? No   Since previous encounter, have you referred patient for abnormal blood glucose that resulted in a new diagnosis or medication change? No   Was there a life-saving intervention made? No         Amb Nursing Assessment - 03/03/16 1133    Pre-visit preparation   Pre-visit preparation completed Yes   Pain Assessment   Pain Assessment 0-10   Pain Score 5    Pain Type Chronic pain   Pain Location Head  stomach   Pain Descriptors / Indicators Headache;Crushing   Pain Onset More than a month ago   Pain Frequency Constant   Pain Relieving Factors nothing   Nutrition Screen   Nutritional Risks None   Diabetes No   Functional Status   Activities of Daily Living Independent   Ambulation Independent   Medication Administration Independent   Home Management Independent   Risk/Barriers  Assessment   Barriers to Care Management & Learning Language   Psychosocial Barriers Uninsured/under-insured   Abuse/Neglect Assessment   Do you feel unsafe in your current relationship? No   Do you feel physically threatened by others? No   Anyone hurting you at home, work, or school? No   Unable to ask? No   Patient Literacy   How often do you need to have someone help you when you read instructions, pamphlets, or other written materials from your doctor or pharmacy? 1 - Never   What is the last grade level you completed in school? 10th grade in [employer]   Language Assistant   Interpreter Needed? Yes   Patient Declined Interpreter  No      Return office visit for confirmation of appointment with PCP [facility] [employer]. Information provided for clinic visit on 03/24/16. Continues to complain of headaches and concern about cyst type growth of outer eyelid on right eye. Describes whitish material "sqeezed" fom lesion. Cautioned regarding touching eye. Follow-up and return to [facility] prn. Ferol LuzMarietta Arlissa Monteverde, RN/CN

## 2016-03-26 NOTE — Telephone Encounter (Signed)
Spoke with family member to give results. Patient is having regular stools now and the appointment was confirmed for 04/02/16.

## 2016-04-02 ENCOUNTER — Ambulatory Visit: Payer: Self-pay | Admitting: Internal Medicine

## 2016-04-06 ENCOUNTER — Ambulatory Visit (INDEPENDENT_AMBULATORY_CARE_PROVIDER_SITE_OTHER): Payer: Self-pay | Admitting: Internal Medicine

## 2016-04-06 ENCOUNTER — Encounter: Payer: Self-pay | Admitting: Internal Medicine

## 2016-04-06 VITALS — BP 110/66 | HR 76 | Resp 18 | Ht 62.5 in | Wt 147.0 lb

## 2016-04-06 DIAGNOSIS — R1032 Left lower quadrant pain: Secondary | ICD-10-CM

## 2016-04-06 DIAGNOSIS — N83202 Unspecified ovarian cyst, left side: Secondary | ICD-10-CM | POA: Insufficient documentation

## 2016-04-06 DIAGNOSIS — G44229 Chronic tension-type headache, not intractable: Secondary | ICD-10-CM

## 2016-04-06 DIAGNOSIS — R519 Headache, unspecified: Secondary | ICD-10-CM

## 2016-04-06 DIAGNOSIS — R51 Headache: Principal | ICD-10-CM

## 2016-04-06 DIAGNOSIS — R109 Unspecified abdominal pain: Secondary | ICD-10-CM | POA: Insufficient documentation

## 2016-04-06 NOTE — Progress Notes (Signed)
   Subjective:    Patient ID: Angela LeedsAline Welch, female    DOB: 03-02-1976, 40 y.o.   MRN: [id number]  HPI   Unable to communicate with the patient, who speaks Kinyarwanda.  Her 7th grade son is brought to interpret for her, but he states he does not understand enough of what is being asked to actually interpret.   Our CMA did call the family regarding her CT results and was able to speak with someone, but the son states today they do not have someone in the family who can help out with interpretation.  She was noted to have what appeared to be a partially ruptured left ovarian cyst and constipation on CT scan 03/08/2016 from [facility].  Unable to clearly find out if she the Famotidine helped with her belching, GERD symptoms.  Unable to clearly find out if her headache is better.  We have not yet been able to get her in for PT for her neck and head.     Merci, her 40 yo son her interpreted before is no longer in state as he is going to college.    Spoke    Review of Systems     Objective:   Physical Exam  NAD      Assessment & Plan:  Headache, abdominal pain, GERD:  Unable to get adequate history.   Called [place], [place]:  She will see if she can find someone to interpret for a rescheduled visit or use her interpretation line. Cancelled appt. And refunded money.

## 2016-04-06 NOTE — Congregational Nurse Program (Unsigned)
Congregational Nurse Program Note  Date of Encounter: 04/06/2016  Past Medical History: No past medical history on file.  Encounter Details:     CNP Questionnaire - 04/06/16 2307    Patient Demographics   Is this a new or existing patient? Existing   Patient is considered a/an Refugee   Race African   Patient Assistance   Location of Patient Assistance Not Applicable   Patient's financial/insurance status Low Income;[place] [facility]/Care Connects   Uninsured Patient Yes   Interventions Appt. has been completed   Patient referred to apply for the following financial assistance Not Applicable   Food insecurities addressed Not Applicable   Transportation assistance Yes   Type of Assistance Taxi Voucher Given   Assistance securing medications No   Educational health offerings Other   Encounter Details   Primary purpose of visit Other   Was an Emergency Department visit averted? Not Applicable   Does patient have a medical provider? Yes   Patient referred to Follow up with established PCP   Was a mental health screening completed? (GAINS tool) No   Does patient have dental issues? No   Does patient have vision issues? Yes   Does your patient have an abnormal blood pressure today? No   Since previous encounter, have you referred patient for abnormal blood pressure that resulted in a new diagnosis or medication change? No   Does your patient have an abnormal blood glucose today? No   Was there a life-saving intervention made? No         Amb Nursing Assessment - 04/06/16 1513    Pre-visit preparation   Pre-visit preparation completed Yes   Pain Assessment   Pain Assessment 0-10   Pain Score 8    Pain Type Acute pain   Pain Location Abdomen  and head   Pain Orientation Upper  lower abdomen and top of head.   Pain Descriptors / Indicators Sore   Pain Onset More than a month ago   Pain Frequency Intermittent   Nutrition Screen   Nutritional Status BMI 25 -29 Overweight   Nutritional Risks None   Diabetes No   Functional Status   Activities of Daily Living Independent   Ambulation Independent   Medication Administration Independent   Home Management Independent   Risk/Barriers  Assessment   Barriers to Care Management & Learning Language   Psychosocial Barriers Uninsured/under-insured   Abuse/Neglect Assessment   Do you feel unsafe in your current relationship? No   Do you feel physically threatened by others? No   Anyone hurting you at home, work, or school? No   Unable to ask? No   Patient Literacy   How often do you need to have someone help you when you read instructions, pamphlets, or other written materials from your doctor or pharmacy? 1 - Never   Web designerLanguage Assistant   Interpreter Needed? Yes   Interpreter Name Caryn BeeKevin   Patient Declined Interpreter  No     Office visit to follow-up appointment canceled by clinic on April 02, 2016. Rescheduled for July 11 at 3 pm to see [facility]ulberry [facility]. Agreed to keep appointment.  Informed that interpreter needed to accompany to appointment. Interview completed in [place] using [employer] phone line. Ferol LuzMarietta Laverna Dossett, RN/MD

## 2016-04-12 ENCOUNTER — Encounter: Payer: Self-pay | Admitting: Internal Medicine

## 2016-04-12 ENCOUNTER — Ambulatory Visit (INDEPENDENT_AMBULATORY_CARE_PROVIDER_SITE_OTHER): Payer: Self-pay | Admitting: Internal Medicine

## 2016-04-12 VITALS — BP 116/64 | HR 72 | Resp 16 | Ht 62.5 in | Wt 146.0 lb

## 2016-04-12 DIAGNOSIS — G44229 Chronic tension-type headache, not intractable: Secondary | ICD-10-CM

## 2016-04-12 DIAGNOSIS — Z331 Pregnant state, incidental: Secondary | ICD-10-CM

## 2016-04-12 DIAGNOSIS — Z3490 Encounter for supervision of normal pregnancy, unspecified, unspecified trimester: Secondary | ICD-10-CM

## 2016-04-12 DIAGNOSIS — N926 Irregular menstruation, unspecified: Secondary | ICD-10-CM

## 2016-04-12 DIAGNOSIS — R1032 Left lower quadrant pain: Secondary | ICD-10-CM

## 2016-04-12 LAB — POCT URINE PREGNANCY: PREG TEST UR: POSITIVE — AB

## 2016-04-12 MED ORDER — PRENATAL VITAMINS PLUS 27-1 MG PO TABS: 1.0000 | ORAL_TABLET | Freq: Every day | ORAL | Status: AC

## 2016-04-12 NOTE — Progress Notes (Signed)
   Subjective:    Patient ID: Angela Welch, female    DOB: [birth year], 40 y.o.   MRN: [id number]  HPI  Josias from [employer] is here to interpret. Madeline, a neighbor, is also helpful in interpretation  1.  Left ovarian cyst with rupture:  Finally able to adequately discuss the findings on CT.  3.1 cm collapsing cyst with fluid in her pelvic and thickening of bladder wall likely due to partially ruptured cyst.  Also with a lot of retained stool throughout colon.   She no long has pain in the Left abdomen as when first seen.    2.  Chronic Headaches:  Tension related.  CT scan of brain normal earlier in year from ED visit.   Waiting to hear from PT regarding referral from beginning of June.  Pt. Refuses to be seen by counselor.  Has not tried Tylenol 1000 mg twice daily as recommended.   Not sleeping well due to headaches, almost daily.    Missed period in June, she thinks.  Not using anything for birth control.  3.  Belching/?GERD:  Was much better on Famotidine 40 mg daily.  Just did not get it refilled after first month    4.  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  She feels this may be related to her headaches.  States because she has headache pain around her eyes.  Also, describes dizziness that is chronic.  Describes decreased equilibrium  This is chronic and seems to have started with the headaches.     Review of Systems     Objective:   Physical Exam Intermittently rubbing at posterior neck--at times appears to have moderate discomfort. HEENT:  PERRL, EOMI, discs sharp bilaterally, small sessile skin tag at lateral canthus of right eye.  NT.   TMs pearly gray.  NT over frontal and maxillary sinuses, nasal mucosa not swollen or boggy, throat without injection or cobbling. Neck:  Supple, but traps bilaterally to shoulders and paraspinous musculature to nuchal ridge tender.  No thyromegaly Chest:  CTA CV:  RRR without murmur or  rub Abd:  S, NT, No HSM or mass, + BS Neuro:  A & O x3, CN II-XII grossly intact, DTRs 2+/4 throughout, Motor 5/5 throughout.  Gait normal.   Urine HCG positive     Assessment & Plan:  1.  Tension Headaches:  Do not feel comfortable prescribing muscle relaxant for neck pain and headaches.  To try the Tylenol as recommended last visit and went over warm packing and stretching until can get into PT referral.  2.  Left pelvic pain:  Resolved.  Likely due to ruptured ovarian cyst.  3.  Pregnancy:  PNV daily and referral to DSS for medicaid vs. [employer]  4.  GERD/Belching.  To get Famotidine filled and continue.  Should be fine for pregnancy.

## 2016-04-12 NOTE — Patient Instructions (Signed)
Tylenol 500 mg tabs, 2 tabs twice daily as needed for headache

## 2016-05-05 ENCOUNTER — Ambulatory Visit: Payer: Self-pay | Admitting: Internal Medicine

## 2016-05-24 ENCOUNTER — Encounter: Payer: Self-pay | Admitting: Internal Medicine

## 2016-07-13 ENCOUNTER — Ambulatory Visit: Payer: Self-pay | Admitting: Internal Medicine
